# Patient Record
Sex: Male | Born: 1935 | Race: White | Hispanic: No | Marital: Married | Smoking: Former smoker
Health system: Southern US, Community
[De-identification: ages and names within clinical notes are randomized; demographics above are authoritative.]

## PROBLEM LIST (undated history)

## (undated) DIAGNOSIS — R55 Syncope and collapse: Secondary | ICD-10-CM

## (undated) DIAGNOSIS — I10 Essential (primary) hypertension: Secondary | ICD-10-CM

## (undated) DIAGNOSIS — N189 Chronic kidney disease, unspecified: Secondary | ICD-10-CM

## (undated) HISTORY — DX: Syncope and collapse: R55

## (undated) HISTORY — PX: GALLBLADDER SURGERY: SHX652

## (undated) HISTORY — PX: KIDNEY STONE SURGERY: SHX686

## (undated) HISTORY — PX: HERNIA REPAIR: SHX51

## (undated) HISTORY — PX: PROSTATE SURGERY: SHX751

## (undated) HISTORY — DX: Chronic kidney disease, unspecified: N18.9

## (undated) HISTORY — DX: Essential (primary) hypertension: I10

---

## 1988-09-10 HISTORY — PX: INTRAOCULAR LENS IMPLANT, SECONDARY: SHX1842

## 2009-02-12 ENCOUNTER — Emergency Department: Payer: Self-pay | Admitting: Emergency Medicine

## 2009-12-23 ENCOUNTER — Ambulatory Visit: Payer: Self-pay | Admitting: Physician Assistant

## 2010-01-11 ENCOUNTER — Ambulatory Visit: Payer: Self-pay | Admitting: Specialist

## 2011-02-09 DIAGNOSIS — R55 Syncope and collapse: Secondary | ICD-10-CM

## 2011-02-09 HISTORY — DX: Syncope and collapse: R55

## 2011-03-07 ENCOUNTER — Observation Stay: Payer: Self-pay | Admitting: Internal Medicine

## 2011-03-07 DIAGNOSIS — R55 Syncope and collapse: Secondary | ICD-10-CM

## 2011-03-07 DIAGNOSIS — I10 Essential (primary) hypertension: Secondary | ICD-10-CM

## 2011-03-07 DIAGNOSIS — I498 Other specified cardiac arrhythmias: Secondary | ICD-10-CM

## 2011-03-07 DIAGNOSIS — I517 Cardiomegaly: Secondary | ICD-10-CM

## 2011-03-08 DIAGNOSIS — I498 Other specified cardiac arrhythmias: Secondary | ICD-10-CM

## 2011-03-12 ENCOUNTER — Encounter: Payer: Medicare Other | Admitting: Cardiovascular Disease

## 2011-03-12 ENCOUNTER — Ambulatory Visit (INDEPENDENT_AMBULATORY_CARE_PROVIDER_SITE_OTHER): Payer: Medicare Other | Admitting: Cardiovascular Disease

## 2011-03-12 ENCOUNTER — Encounter: Payer: Self-pay | Admitting: Cardiovascular Disease

## 2011-03-12 DIAGNOSIS — N189 Chronic kidney disease, unspecified: Secondary | ICD-10-CM | POA: Insufficient documentation

## 2011-03-12 DIAGNOSIS — R609 Edema, unspecified: Secondary | ICD-10-CM

## 2011-03-12 DIAGNOSIS — I10 Essential (primary) hypertension: Secondary | ICD-10-CM | POA: Insufficient documentation

## 2011-03-12 DIAGNOSIS — R42 Dizziness and giddiness: Secondary | ICD-10-CM | POA: Insufficient documentation

## 2011-03-12 MED ORDER — FUROSEMIDE 20 MG PO TABS: 20.0000 mg | ORAL_TABLET | Freq: Every day | ORAL | Status: DC | PRN

## 2011-03-12 MED ORDER — HYDRALAZINE HCL 50 MG PO TABS: 50.0000 mg | ORAL_TABLET | Freq: Three times a day (TID) | ORAL | Status: DC

## 2011-03-12 NOTE — Patient Instructions (Addendum)
You are doing well. Please increase the hydralazine to 50 mg three times a day Monitor the blood pressure, drop the numbers to the office in 2 weeks. Please call us if you have new issues that need to be addressed before your next appt.  Your physician recommends that you schedule a follow-up appointment in: 1 month

## 2011-03-12 NOTE — Assessment & Plan Note (Signed)
He had an episode of dizziness with noted bradycardia on admission to the hospital. He has had no further lightheadedness. On telemetry, he did have bradycardia overnight while sleeping though rate improved  during the daytime. He certainly could have sick sinus currently his rate is adequate. He is asymptomatic. We'll continue to monitor him. He reports having a two-day Holter and we will look for these results at [facility].

## 2011-03-12 NOTE — Assessment & Plan Note (Addendum)
Creatinine of 2.2 in the hospital. Stage III chronic kidney disease, stable.  I suspect this is from long-standing hypertension. We have discussed this with him and we'll work to improve his blood pressure.

## 2011-03-12 NOTE — Progress Notes (Signed)
   Patient ID: [name], male    DOB: 1911/05/26, 77 y.o.   MRN: [id number]  HPI Comments: [name] is a 77 year old gentleman, known to me from [facility] with a history of chronic hypertension that has been poorly controlled, renal dysfunction, who presented to the hospital on June 27 with bradycardia, severe hypertension. Cardiology was consulted to manage his blood pressure. He was discharged on hydralazine t.i.d. Family reports that he is only taking it b.i.d..  He is no complaints though does report that his blood pressure continues to be elevated. Systolic pressures at home are typically in the 160-180 range. Heart rates have been well controlled and typically in the 50-70 range. He has no symptoms of lightheadedness. He does have problems with urine retention, polyuria at night time.  He denies any shortness of breath or chest pain. Otherwise has no complaints. He likes to work in his garden.  Echo showed normal systolic function, EF >55%, mild to moderately elevated right ventricular systolic pressures, diastolic dysfunction  EKG shows normal sinus rhythm with rate 73 beats per minute, left axis deviation     Review of Systems  Constitutional: Negative.   HENT: Negative.   Eyes: Negative.   Respiratory: Negative.   Cardiovascular: Negative.   Gastrointestinal: Negative.   Musculoskeletal: Negative.   Skin: Negative.   Neurological: Negative.   Hematological: Negative.   Psychiatric/Behavioral: Negative.   All other systems reviewed and are negative.    BP 177/80  Pulse 73  Ht 5\' 5"  (1.651 m)  Wt 155 lb (70.308 kg)  BMI 25.79 kg/m2  Physical Exam  Nursing note and vitals reviewed. Constitutional: He is oriented to person, place, and time. He appears well-developed and well-nourished.  HENT:  Head: Normocephalic.  Nose: Nose normal.  Mouth/Throat: Oropharynx is clear and moist.       Hard of hearing.  Eyes: Conjunctivae are normal. Pupils are equal, round, and  reactive to light.  Neck: Normal range of motion. Neck supple. No JVD present. Carotid bruit is present.  Cardiovascular: Normal rate, regular rhythm, S1 normal, S2 normal and intact distal pulses.  Exam reveals no gallop and no friction rub.   Murmur heard.  Crescendo systolic murmur is present with a grade of 2/6  Pulses:      Carotid pulses are 2+ on the right side, and 2+ on the left side with bruit.      Radial pulses are 2+ on the right side, and 2+ on the left side.       Posterior tibial pulses are 2+ on the right side, and 2+ on the left side.       Trace pitting edema above the sock line in the mid shin  Pulmonary/Chest: Effort normal and breath sounds normal. No respiratory distress. He has no wheezes. He has no rales. He exhibits no tenderness.  Abdominal: Soft. Bowel sounds are normal. He exhibits no distension. There is no tenderness.  Musculoskeletal: Normal range of motion. He exhibits edema. He exhibits no tenderness.  Lymphadenopathy:    He has no cervical adenopathy.  Neurological: He is alert and oriented to person, place, and time. Coordination normal.  Skin: Skin is warm and dry. No rash noted. No erythema.  Psychiatric: He has a normal mood and affect. His behavior is normal. Judgment and thought content normal.           Assessment and Plan

## 2011-03-12 NOTE — Assessment & Plan Note (Addendum)
Echocardiogram had showed mild to moderately elevated right ventricular systolic pressures. He is reluctant to take a water pill. He does have trace edema that is pitting bilaterally. We have given a prescription for Lasix 20 mg to be taken p.r.n. For worsening edema

## 2011-03-12 NOTE — Assessment & Plan Note (Signed)
Blood pressure continues to be elevated. We have talked to him about the blood pressure medication and he is willing to take it. We'll increase the hydralazine to 50 mg t.i.d. We'll have the family got off blood pressure measurements in 2 weeks' time. If he continues to be elevated, we could increase the hydralazine to 50 mg q.i.d. Or 100 mg t.i.d..

## 2011-03-13 ENCOUNTER — Telehealth: Payer: Self-pay | Admitting: *Deleted

## 2011-03-13 NOTE — Telephone Encounter (Signed)
Pt's daughter called this AM stating that pt is having side effects of increased dose of hydralazine that was changed in office yesterday. Pt was previously taking Hydralazine 25mg  bid (had been taking for about 5 days) without any side effects. States after incr to 50mg  tid, pt did not sleep, felt cold all night and feels very jittery this AM after his AM dose and his symptoms are "wearing off now at 1:00pm." Advised that pt return to original dose until further notice, and will discuss with Dr. Mariah Milling. Please advise.

## 2011-03-13 NOTE — Telephone Encounter (Signed)
Spoke to pt's daughter after discussing with Dr. Mariah Milling. Advised that pt take 25mg  tid to start, then titrate up to QID. Notified that he may just need to adjust to higher dosage, and symptoms may improve after this. Pt will call us back with any changes.

## 2011-03-19 ENCOUNTER — Telehealth: Payer: Self-pay | Admitting: *Deleted

## 2011-03-19 MED ORDER — HYDRALAZINE HCL 25 MG PO TABS: 25.0000 mg | ORAL_TABLET | Freq: Four times a day (QID) | ORAL | Status: DC

## 2011-03-19 NOTE — Telephone Encounter (Signed)
Pt was recently incr from Hydralazine 25mg  to 50mg  and had a reaction per phone note, so we decr back to 25mg  tid, and if tolerates to titrate to qid. Pt's wife calling stating they need new rx with changes that were made. Pt has been tolerating 25mg  tid, and will incr to qid.

## 2011-04-11 ENCOUNTER — Encounter: Payer: Self-pay | Admitting: Cardiovascular Disease

## 2011-04-12 ENCOUNTER — Encounter: Payer: Self-pay | Admitting: Cardiovascular Disease

## 2011-04-12 ENCOUNTER — Ambulatory Visit (INDEPENDENT_AMBULATORY_CARE_PROVIDER_SITE_OTHER): Payer: Medicare Other | Admitting: Cardiovascular Disease

## 2011-04-12 DIAGNOSIS — I10 Essential (primary) hypertension: Secondary | ICD-10-CM

## 2011-04-12 DIAGNOSIS — R42 Dizziness and giddiness: Secondary | ICD-10-CM

## 2011-04-12 DIAGNOSIS — N189 Chronic kidney disease, unspecified: Secondary | ICD-10-CM

## 2011-04-12 DIAGNOSIS — R609 Edema, unspecified: Secondary | ICD-10-CM

## 2011-04-12 MED ORDER — HYDRALAZINE HCL 25 MG PO TABS: 25.0000 mg | ORAL_TABLET | Freq: Four times a day (QID) | ORAL | Status: DC

## 2011-04-12 NOTE — Assessment & Plan Note (Signed)
History of chronic renal insufficiency, likely from long history of hypertension

## 2011-04-12 NOTE — Assessment & Plan Note (Signed)
He does not report any significant dizziness at this time.

## 2011-04-12 NOTE — Progress Notes (Signed)
   Patient ID: [name], male    DOB: 12/02/1910, 77 y.o.   MRN: [id number]  HPI Comments: [name] is a 77 year old gentleman, known to me from [name] with a history of chronic hypertension that has been poorly controlled, renal dysfunction, who presented to the hospital on June 27 with bradycardia, severe hypertension. Cardiology was consulted to manage his blood pressure. He was discharged on hydralazine t.i.d.  He is no complaints though does report that his blood pressure continues to be elevated. Systolic pressures at home are typically in the 140s to 160s.Old  Heart rates have been well controlled and typically in the 50-70 range. He has no symptoms of lightheadedness. He does have problems with urine retention, polyuria at night time.  He denies any shortness of breath or chest pain. Otherwise has no complaints. He likes to work in his garden.  Echo showed normal systolic function, EF >55%, mild to moderately elevated right ventricular systolic pressures, diastolic dysfunction  Old EKG shows normal sinus rhythm with rate 73 beats per minute, left axis deviation    Outpatient Encounter Prescriptions as of 04/12/2011  Medication Sig Dispense Refill  . aspirin 81 MG tablet Take 81 mg by mouth daily.        . furosemide (LASIX) 20 MG tablet Take 1 tablet (20 mg total) by mouth daily as needed.  30 tablet  6  .  hydrALAZINE (APRESOLINE) 25 MG tablet Take 25 mg by mouth 3 (three) times daily.         Review of Systems  Constitutional: Negative.   HENT: Negative.   Eyes: Negative.   Respiratory: Negative.   Cardiovascular: Negative.   Gastrointestinal: Negative.   Musculoskeletal: Negative.   Skin: Negative.   Neurological: Negative.   Hematological: Negative.   Psychiatric/Behavioral: Negative.   All other systems reviewed and are negative.   BP 147/61  Pulse 59  Ht 5\' 4"  (1.626 m)  Wt 156 lb (70.761 kg)  BMI 26.78 kg/m2  Physical Exam  Nursing note and vitals  reviewed. Constitutional: He is oriented to person, place, and time. He appears well-developed and well-nourished.       Elderly gentleman   HENT:  Head: Normocephalic.  Nose: Nose normal.  Mouth/Throat: Oropharynx is clear and moist.  Eyes: Conjunctivae are normal. Pupils are equal, round, and reactive to light.  Neck: Normal range of motion. Neck supple. No JVD present.  Cardiovascular: Normal rate, regular rhythm, S1 normal, S2 normal, normal heart sounds and intact distal pulses.  Exam reveals no gallop and no friction rub.   No murmur heard. Pulmonary/Chest: Effort normal and breath sounds normal. No respiratory distress. He has no wheezes. He has no rales. He exhibits no tenderness.  Abdominal: Soft. Bowel sounds are normal. He exhibits no distension. There is no tenderness.  Musculoskeletal: Normal range of motion. He exhibits no edema and no tenderness.  Lymphadenopathy:    He has no cervical adenopathy.  Neurological: He is alert and oriented to person, place, and time. Coordination normal.  Skin: Skin is warm and dry. No rash noted. No erythema.  Psychiatric: He has a normal mood and affect. His behavior is normal. Judgment and thought content normal.           Assessment and Plan

## 2011-04-12 NOTE — Assessment & Plan Note (Signed)
We will increase his hydralazine to 25 mg q.i.d. We tried 50 mg t.i.d. And telephone notes indicate that he did not tolerate this well. Notes indicate reaction though he is uncertain as to the details of what happened. Family is also unaware.

## 2011-04-12 NOTE — Assessment & Plan Note (Signed)
Edema has improved, very mild. It does come and go. We have suggested he take Lasix p.r.n..

## 2011-04-12 NOTE — Patient Instructions (Signed)
You are doing well. Please increase the hydralazine to four times a day Please call us if you have new issues that need to be addressed before your next appt.  We will call you for a follow up Appt. In 6 months

## 2011-05-02 ENCOUNTER — Ambulatory Visit (INDEPENDENT_AMBULATORY_CARE_PROVIDER_SITE_OTHER): Payer: 59 | Admitting: Internal Medicine

## 2011-05-02 ENCOUNTER — Encounter: Payer: Self-pay | Admitting: Internal Medicine

## 2011-05-02 ENCOUNTER — Ambulatory Visit: Payer: Medicare Other | Admitting: Internal Medicine

## 2011-05-02 ENCOUNTER — Ambulatory Visit: Payer: Self-pay | Admitting: Internal Medicine

## 2011-05-02 VITALS — BP 147/76 | HR 70 | Temp 98.0°F | Resp 14 | Ht 64.0 in | Wt 153.5 lb

## 2011-05-02 DIAGNOSIS — R5383 Other fatigue: Secondary | ICD-10-CM

## 2011-05-02 DIAGNOSIS — R5381 Other malaise: Secondary | ICD-10-CM

## 2011-05-02 DIAGNOSIS — M25572 Pain in left ankle and joints of left foot: Secondary | ICD-10-CM

## 2011-05-02 DIAGNOSIS — M25579 Pain in unspecified ankle and joints of unspecified foot: Secondary | ICD-10-CM

## 2011-05-02 LAB — CBC WITH DIFFERENTIAL/PLATELET
Basophils Relative: 0.8 % (ref 0.0–3.0)
Eosinophils Absolute: 0.2 10*3/uL (ref 0.0–0.7)
HCT: 33 % — ABNORMAL LOW (ref 39.0–52.0)
Hemoglobin: 10.9 g/dL — ABNORMAL LOW (ref 13.0–17.0)
Lymphocytes Relative: 23.8 % (ref 12.0–46.0)
Lymphs Abs: 1.3 10*3/uL (ref 0.7–4.0)
MCHC: 33.1 g/dL (ref 30.0–36.0)
Monocytes Relative: 8 % (ref 3.0–12.0)
Neutro Abs: 3.6 10*3/uL (ref 1.4–7.7)
RBC: 3.62 Mil/uL — ABNORMAL LOW (ref 4.22–5.81)

## 2011-05-02 LAB — COMPREHENSIVE METABOLIC PANEL
BUN: 45 mg/dL — ABNORMAL HIGH (ref 6–23)
CO2: 25 mEq/L (ref 19–32)
Calcium: 9.1 mg/dL (ref 8.4–10.5)
Chloride: 108 mEq/L (ref 96–112)
Creatinine, Ser: 2.3 mg/dL — ABNORMAL HIGH (ref 0.4–1.5)
GFR: 27.9 mL/min — ABNORMAL LOW (ref >60.00)

## 2011-05-02 LAB — VITAMIN B12: Vitamin B-12: 513 pg/mL (ref 211–911)

## 2011-05-02 NOTE — Progress Notes (Signed)
Subjective:    Patient ID: [name], male    DOB: Dec 22, 1910, 76 y.o.   MRN: [id number]  HPI  Mr. Denz is a 77 year old male with a history of hypertension and chronic kidney disease who presents for an acute visit for left ankle pain. He reports that his left ankle has been bothering him for approximately 2-3 months. He denies any known injury to his ankle. He describes the pain as aching pain which occurs with weightbearing. The pain is most prominent on the lateral aspect of his left ankle. He has been applying both heat and ice to his ankle without any improvement in his symptoms. He has not been taking any medication for pain. He denies any instability in his ankle. He denies any recent falls. He reports mild swelling in his left ankle. He denies any redness over his left ankle. He denies any fever, chills.   Mr. Berent also complains of mild fatigue which has been present over the last several weeks. Of note his wife has been very ill and has been in the hospital on several occasions and had recent orthopedic surgery. He does have knowledge some anxiety in regards to his wife's care which has limited his sleep. He reports a good appetite, and has been able to do his normal activities such as gardening. He is concerned that some of his new medications for hypertension may be contributing to his fatigue. He denies any chest pain, diaphoresis, fever, chills, or other symptoms.  Outpatient Encounter Prescriptions as of 05/02/2011  Medication Sig Dispense Refill  . aspirin 81 MG tablet Take 81 mg by mouth daily.        . furosemide (LASIX) 20 MG tablet Take 1 tablet (20 mg total) by mouth daily as needed.  30 tablet  6  . hydrALAZINE (APRESOLINE) 25 MG tablet Take 1 tablet (25 mg total) by mouth 4 (four) times daily.  120 tablet  11     Review of Systems  Constitutional: Negative for fever, chills, diaphoresis, appetite change and fatigue.  HENT: Positive for hearing loss. Negative for trouble  swallowing.   Eyes: Negative for visual disturbance.  Respiratory: Negative for chest tightness, shortness of breath and wheezing.   Cardiovascular: Positive for leg swelling. Negative for chest pain.  Gastrointestinal: Negative for nausea, vomiting, abdominal pain, diarrhea, constipation and blood in stool.  Genitourinary: Positive for urgency. Negative for dysuria.  Musculoskeletal: Positive for myalgias, joint swelling and arthralgias. Negative for gait problem.  Skin: Negative for color change.  Neurological: Positive for speech difficulty. Negative for weakness.  Psychiatric/Behavioral: Positive for sleep disturbance. Negative for behavioral problems and confusion. The patient is nervous/anxious.    BP 147/76  Pulse 70  Temp(Src) 98 F (36.7 C) (Oral)  Resp 14  Ht 5\' 4"  (1.626 m)  Wt 153 lb 8 oz (69.627 kg)  BMI 26.35 kg/m2  SpO2 99%     Objective:   Physical Exam  Constitutional: He is oriented to person, place, and time. He appears well-developed and well-nourished. No distress.  HENT:  Head: Normocephalic and atraumatic.  Eyes: Conjunctivae and EOM are normal. Pupils are equal, round, and reactive to light.  Neck: Normal range of motion.  Cardiovascular: Normal rate and regular rhythm.   Pulmonary/Chest: Effort normal and breath sounds normal. No respiratory distress. He has no wheezes. He has no rales.  Musculoskeletal:       Feet:  Neurological: He is alert and oriented to person, place, and time.  Skin:  Skin is warm and dry. He is not diaphoretic. No erythema.  Psychiatric: He has a normal mood and affect. His behavior is normal. Judgment and thought content normal.          Assessment & Plan:  1. Ankle pain - patient with prolonged history of left lateral ankle pain. No history of trauma to his ankle. Exam is remarkable for tenderness over the left lateral malleolus. Plain film obtained today was unremarkable. He would likely benefit from nonsteroidals, however  he is unable to take them because of chronic kidney disease. We will plan to use an ankle brace over the next one to 2 weeks. He will continue to apply ice to his ankle several times per day. If his pain persists he may need additional evaluation by orthopedics. He will call if pain does not improve. Otherwise he will followup in one month.  2. Fatigue - likely multifactorial. Patient acknowledges limited sleep recently with caring for his wife who has been ill. He also has chronic kidney disease and has recently been placed on hydralazine and furosemide. We will check kidney function and electrolytes with labs. We'll also check thyroid function and B12 level as well as CBC. He will follow up in 1 month.

## 2011-05-02 NOTE — Patient Instructions (Signed)
Ankle Pain Ankle pain is a common symptom. The bones, cartilage, tendons, and muscles of the ankle joint perform a lot of work each day. The ankle joint holds your body weight and allows you to move around. Ankle pain can occur on either side or back of 1 or both ankles. Ankle pain may be sharp and burning or dull and aching. There may be tenderness, stiffness, redness, or warmth around the ankle. The pain occurs more often when a person walks or puts pressure on the ankle. CAUSES There are many reasons ankle pain can develop. It is important to work with your caregiver to identify the cause since many conditions can impact the bones, cartilage, muscles, and tendons. Causes for ankle pain include:  Injury, including a break (fracture), sprain, or strain often due to a fall, sports, or a high-impact activity.   Swelling (inflammation) of a tendon (tendonitis).   Achilles tendon rupture.   Ankle instability after repeated sprains and strains.   Poor foot alignment.   Pressure on a nerve (tarsal tunnel syndrome).   Arthritis in the ankle or the lining of the ankle.   Crystal formation in the ankle (gout or pseudogout).  DIAGNOSIS A diagnosis is based on your medical history, your symptoms, results of your physical exam, and results of diagnostic tests. Diagnostic tests may include X-ray exams or a computerized magnetic scan (magnetic resonance imaging, MRI). TREATMENT Treatment will depend on the cause of your ankle pain and may include:  Keeping pressure off the ankle and limiting activities.   Using crutches or other walking support (a cane or brace).   Using rest, ice, compression, and elevation.   Participating in physical therapy or home exercises.   Wearing shoe inserts or special shoes.   Losing weight.   Taking medications to reduce pain or swelling or receiving an injection.   Undergoing surgery.  HOME CARE INSTRUCTIONS  Only take over-the-counter or prescription  medicines for pain, discomfort, or fever as directed by your caregiver.   Put ice on the injured area.   Put ice in a plastic bag.   Place a towel between your skin and the bag.   Leave the ice on for 20 minutes at a time, 3 times a day.   Keep your leg raised (elevated) when possible to lessen swelling.   Avoid activities that cause ankle pain.   Follow specific exercises as directed by your caregiver.   Record how often you have ankle pain, the location of the pain, and what it feels like. This information may be helpful to you and your caregiver.   Ask your caregiver about returning to work or sports and whether you should drive.   Follow up with your caregiver for further examination, therapy, or testing as directed.  SEEK MEDICAL CARE IF:  Pain or swelling continues or worsens beyond 1 week.   You have an oral temperature above 101.5.   You are feeling unwell or have chills.   You are having an increasingly difficult time with walking.   You have loss of sensation or other new symptoms.   You have questions or concerns.  MAKE SURE YOU:  Understand these instructions.   Will watch your condition.   Will get help right away if you are not doing well or get worse.  Document Released: 02/14/2010  [facility] Patient Information 2011 [place], [place].

## 2011-05-21 ENCOUNTER — Other Ambulatory Visit: Payer: Self-pay | Admitting: Internal Medicine

## 2011-05-21 ENCOUNTER — Telehealth: Payer: Self-pay | Admitting: *Deleted

## 2011-05-21 DIAGNOSIS — M79673 Pain in unspecified foot: Secondary | ICD-10-CM

## 2011-05-21 NOTE — Telephone Encounter (Signed)
Left message for someone to return my call.

## 2011-05-21 NOTE — Telephone Encounter (Signed)
Daughter is asking if patient could come in for labs for a uric acid. She feels that it should be checked because it hasn't been checked in a while and she thinks that it may be the cause of the pain in his feet. Please advise.

## 2011-05-21 NOTE — Telephone Encounter (Signed)
I put in a request for this earlier today. I asked for him to see Dr. Al Corpus.

## 2011-05-21 NOTE — Telephone Encounter (Signed)
That would be fine. Uric acid and a CMP

## 2011-05-21 NOTE — Telephone Encounter (Signed)
Spoke with daughter, she says that now want to hold off on the lab work, and instead are asking for a referral for him to see a podiatrist.

## 2011-05-22 ENCOUNTER — Encounter: Payer: Self-pay | Admitting: Internal Medicine

## 2011-05-23 ENCOUNTER — Encounter: Payer: Self-pay | Admitting: Internal Medicine

## 2011-06-28 ENCOUNTER — Telehealth: Payer: Self-pay

## 2011-06-28 MED ORDER — HYDRALAZINE HCL 25 MG PO TABS: 25.0000 mg | ORAL_TABLET | Freq: Four times a day (QID) | ORAL | Status: DC

## 2011-06-28 NOTE — Telephone Encounter (Signed)
Refill for hydralazine 25 mg qid

## 2011-08-23 ENCOUNTER — Ambulatory Visit (INDEPENDENT_AMBULATORY_CARE_PROVIDER_SITE_OTHER): Payer: 59 | Admitting: Internal Medicine

## 2011-08-23 ENCOUNTER — Encounter: Payer: Self-pay | Admitting: Internal Medicine

## 2011-08-23 VITALS — BP 152/66 | HR 74 | Temp 99.5°F | Wt 156.0 lb

## 2011-08-23 DIAGNOSIS — J40 Bronchitis, not specified as acute or chronic: Secondary | ICD-10-CM

## 2011-08-23 MED ORDER — GUAIFENESIN-CODEINE 100-10 MG/5ML PO SYRP: 5.0000 mL | ORAL_SOLUTION | Freq: Two times a day (BID) | ORAL | Status: DC | PRN

## 2011-08-23 MED ORDER — AZITHROMYCIN 250 MG PO TABS: ORAL_TABLET | ORAL | Status: AC

## 2011-08-23 NOTE — Progress Notes (Signed)
  Subjective:    Patient ID: [name], male    DOB: [birth year], 77 y.o.   MRN: [id number]  HPI 77 year old male presents for an acute visit complaining of a two-day history of cough productive of thick yellow sputum and some nasal congestion and postnasal drip. He denies shortness of breath or chest pain. He has felt cold at home but has not checked his temperature. Several others in his home have been sick with similar symptoms. He has not taken any medication for this.  Outpatient Encounter Prescriptions as of 08/23/2011  Medication Sig Dispense Refill  . aspirin 81 MG tablet Take 81 mg by mouth daily.        . hydrALAZINE (APRESOLINE) 25 MG tablet Take 1 tablet (25 mg total) by mouth 4 (four) times daily.  360 tablet  3  . furosemide (LASIX) 20 MG tablet Take 1 tablet (20 mg total) by mouth daily as needed.  30 tablet  6    Review of Systems  Constitutional: Positive for fever. Negative for chills, activity change, appetite change, fatigue and unexpected weight change.  HENT: Positive for congestion, rhinorrhea and postnasal drip.   Eyes: Negative for visual disturbance.  Respiratory: Positive for cough. Negative for shortness of breath.   Cardiovascular: Negative for chest pain, palpitations and leg swelling.  Gastrointestinal: Negative for abdominal pain and abdominal distention.  Skin: Negative for color change and rash.  Hematological: Negative for adenopathy.  Psychiatric/Behavioral: Negative for sleep disturbance and dysphoric mood. The patient is not nervous/anxious.    BP 152/66  Pulse 74  Temp(Src) 99.5 F (37.5 C) (Oral)  Wt 156 lb (70.761 kg)  SpO2 94%     Objective:   Physical Exam  Constitutional: He is oriented to person, place, and time. He appears well-developed and well-nourished. No distress.  HENT:  Head: Normocephalic and atraumatic.  Right Ear: External ear normal.  Left Ear: External ear normal.  Nose: Nose normal.  Mouth/Throat: Oropharynx is clear and  moist. No oropharyngeal exudate.  Eyes: Conjunctivae and EOM are normal. Pupils are equal, round, and reactive to light. Right eye exhibits no discharge. Left eye exhibits no discharge. No scleral icterus.  Neck: Normal range of motion. Neck supple. No tracheal deviation present. No thyromegaly present.  Cardiovascular: Normal rate, regular rhythm and normal heart sounds.  Exam reveals no gallop and no friction rub.   No murmur heard. Pulmonary/Chest: Effort normal. No respiratory distress. He has no decreased breath sounds. He has no wheezes. He has rhonchi. He has no rales. He exhibits no tenderness.  Musculoskeletal: Normal range of motion. He exhibits no edema.  Lymphadenopathy:    He has no cervical adenopathy.  Neurological: He is alert and oriented to person, place, and time. No cranial nerve deficit. Coordination normal.  Skin: Skin is warm and dry. No rash noted. He is not diaphoretic. No erythema. No pallor.  Psychiatric: He has a normal mood and affect. His behavior is normal. Judgment and thought content normal.          Assessment & Plan:  1. Bronchitis -  Symptoms are most consistent with early bronchitis. Will treat with azithromycin. Will use codeine as needed for cough. He will call or return to clinic if symptoms are not improving over the next 48 hours. Otherwise, he will followup in 2 weeks.

## 2011-09-06 ENCOUNTER — Ambulatory Visit: Payer: 59 | Admitting: Internal Medicine

## 2011-09-20 ENCOUNTER — Ambulatory Visit: Payer: Self-pay | Admitting: Physician Assistant

## 2012-02-08 ENCOUNTER — Inpatient Hospital Stay: Payer: Self-pay | Admitting: Internal Medicine

## 2012-02-08 LAB — CBC
HGB: 9.5 g/dL — ABNORMAL LOW (ref 13.0–18.0)
MCV: 93 fL (ref 80–100)
Platelet: 135 10*3/uL — ABNORMAL LOW (ref 150–440)
RBC: 3.17 10*6/uL — ABNORMAL LOW (ref 4.40–5.90)
WBC: 7.4 10*3/uL (ref 3.8–10.6)

## 2012-02-08 LAB — COMPREHENSIVE METABOLIC PANEL
Albumin: 2.9 g/dL — ABNORMAL LOW (ref 3.4–5.0)
Alkaline Phosphatase: 56 U/L (ref 50–136)
Anion Gap: 11 (ref 7–16)
Bilirubin,Total: 0.5 mg/dL (ref 0.2–1.0)
Calcium, Total: 8.6 mg/dL (ref 8.5–10.1)
Chloride: 110 mmol/L — ABNORMAL HIGH (ref 98–107)
Co2: 21 mmol/L (ref 21–32)
EGFR (Non-African Amer.): 22 — ABNORMAL LOW
Potassium: 4 mmol/L (ref 3.5–5.1)
SGOT(AST): 21 U/L (ref 15–37)
SGPT (ALT): 13 U/L
Sodium: 142 mmol/L (ref 136–145)

## 2012-02-08 LAB — URINALYSIS, COMPLETE
Bilirubin,UR: NEGATIVE
Blood: NEGATIVE
Glucose,UR: NEGATIVE mg/dL (ref 0–75)
Leukocyte Esterase: NEGATIVE
Ph: 5 (ref 4.5–8.0)
Squamous Epithelial: <1
WBC UR: 1 /HPF (ref 0–5)

## 2012-02-08 LAB — DIGOXIN LEVEL: Digoxin: 0.13 ng/mL

## 2012-02-08 LAB — TROPONIN I: Troponin-I: 0.04 ng/mL

## 2012-02-08 LAB — PRO B NATRIURETIC PEPTIDE: B-Type Natriuretic Peptide: 3163 pg/mL — ABNORMAL HIGH (ref 0–450)

## 2012-02-09 LAB — BASIC METABOLIC PANEL
BUN: 42 mg/dL — ABNORMAL HIGH (ref 7–18)
Calcium, Total: 8.4 mg/dL — ABNORMAL LOW (ref 8.5–10.1)
Chloride: 108 mmol/L — ABNORMAL HIGH (ref 98–107)
Co2: 22 mmol/L (ref 21–32)
EGFR (Non-African Amer.): 23 — ABNORMAL LOW
Glucose: 91 mg/dL (ref 65–99)
Potassium: 3.8 mmol/L (ref 3.5–5.1)

## 2012-02-09 LAB — CBC WITH DIFFERENTIAL/PLATELET
Basophil %: 0.3 %
Eosinophil #: 0.1 10*3/uL (ref 0.0–0.7)
Eosinophil %: 0.9 %
HCT: 27 % — ABNORMAL LOW (ref 40.0–52.0)
MCH: 30.4 pg (ref 26.0–34.0)
Monocyte #: 0.5 x10 3/mm (ref 0.2–1.0)
Monocyte %: 7.7 %
Neutrophil #: 5.1 10*3/uL (ref 1.4–6.5)
Platelet: 128 10*3/uL — ABNORMAL LOW (ref 150–440)
RBC: 2.96 10*6/uL — ABNORMAL LOW (ref 4.40–5.90)
RDW: 14.1 % (ref 11.5–14.5)

## 2012-02-13 LAB — CULTURE, BLOOD (SINGLE)

## 2012-05-07 ENCOUNTER — Other Ambulatory Visit: Payer: Self-pay | Admitting: Cardiovascular Disease

## 2012-05-07 NOTE — Telephone Encounter (Signed)
Refill sent for hydralazine  

## 2012-05-18 ENCOUNTER — Inpatient Hospital Stay: Payer: Self-pay | Admitting: Internal Medicine

## 2012-05-18 LAB — COMPREHENSIVE METABOLIC PANEL
Alkaline Phosphatase: 70 U/L (ref 50–136)
Anion Gap: 6 — ABNORMAL LOW (ref 7–16)
BUN: 34 mg/dL — ABNORMAL HIGH (ref 7–18)
Bilirubin,Total: 0.3 mg/dL (ref 0.2–1.0)
Calcium, Total: 8.8 mg/dL (ref 8.5–10.1)
Chloride: 113 mmol/L — ABNORMAL HIGH (ref 98–107)
Co2: 25 mmol/L (ref 21–32)
EGFR (African American): 23 — ABNORMAL LOW
EGFR (Non-African Amer.): 20 — ABNORMAL LOW
Osmolality: 294 (ref 275–301)
SGPT (ALT): 14 U/L (ref 12–78)
Sodium: 144 mmol/L (ref 136–145)

## 2012-05-18 LAB — CBC
HCT: 29.1 % — ABNORMAL LOW (ref 40.0–52.0)
HGB: 9.6 g/dL — ABNORMAL LOW (ref 13.0–18.0)
MCHC: 33.2 g/dL (ref 32.0–36.0)
MCV: 91 fL (ref 80–100)
Platelet: 153 10*3/uL (ref 150–440)
RBC: 3.21 10*6/uL — ABNORMAL LOW (ref 4.40–5.90)
RDW: 14.2 % (ref 11.5–14.5)
WBC: 3.8 10*3/uL (ref 3.8–10.6)

## 2012-05-19 LAB — CBC WITH DIFFERENTIAL/PLATELET
Basophil #: 0 10*3/uL (ref 0.0–0.1)
Basophil %: 1 %
Eosinophil %: 5.5 %
MCH: 29.6 pg (ref 26.0–34.0)
MCV: 90 fL (ref 80–100)
Neutrophil #: 2.4 10*3/uL (ref 1.4–6.5)
Neutrophil %: 54.6 %
Platelet: 158 10*3/uL (ref 150–440)
RBC: 3.14 10*6/uL — ABNORMAL LOW (ref 4.40–5.90)
RDW: 13.9 % (ref 11.5–14.5)
WBC: 4.5 10*3/uL (ref 3.8–10.6)

## 2012-05-19 LAB — BASIC METABOLIC PANEL
BUN: 33 mg/dL — ABNORMAL HIGH (ref 7–18)
Calcium, Total: 8.5 mg/dL (ref 8.5–10.1)
Creatinine: 2.39 mg/dL — ABNORMAL HIGH (ref 0.60–1.30)
EGFR (African American): 25 — ABNORMAL LOW
EGFR (Non-African Amer.): 22 — ABNORMAL LOW
Glucose: 82 mg/dL (ref 65–99)
Osmolality: 293 (ref 275–301)
Potassium: 4.4 mmol/L (ref 3.5–5.1)

## 2012-05-21 ENCOUNTER — Telehealth: Payer: Self-pay | Admitting: Internal Medicine

## 2012-05-21 NOTE — Telephone Encounter (Signed)
Patient has hospital follow up 9.26.13 @ 9:30.

## 2012-05-24 LAB — WOUND AEROBIC CULTURE

## 2012-05-24 LAB — CULTURE, BLOOD (SINGLE)

## 2012-05-28 ENCOUNTER — Telehealth: Payer: Self-pay | Admitting: Internal Medicine

## 2012-05-28 ENCOUNTER — Ambulatory Visit (INDEPENDENT_AMBULATORY_CARE_PROVIDER_SITE_OTHER): Payer: 59 | Admitting: Cardiovascular Disease

## 2012-05-28 ENCOUNTER — Encounter: Payer: Self-pay | Admitting: Cardiovascular Disease

## 2012-05-28 VITALS — BP 130/62 | HR 76 | Ht 62.0 in | Wt 153.8 lb

## 2012-05-28 DIAGNOSIS — R609 Edema, unspecified: Secondary | ICD-10-CM

## 2012-05-28 DIAGNOSIS — I10 Essential (primary) hypertension: Secondary | ICD-10-CM

## 2012-05-28 DIAGNOSIS — N4889 Other specified disorders of penis: Secondary | ICD-10-CM

## 2012-05-28 MED ORDER — NYSTATIN-TRIAMCINOLONE 100000-0.1 UNIT/GM-% EX OINT: TOPICAL_OINTMENT | Freq: Three times a day (TID) | CUTANEOUS | Status: DC

## 2012-05-28 NOTE — Assessment & Plan Note (Signed)
Left lower extremity edema is likely lymphedema. No signs of heart failure apart from the left leg edema. No shortness of breath, no edema the right. We have suggested he wear a compression hose, possibly ace wrap. Uncertain if symptoms have been exacerbated by recent injury to his left leg. Stitches to be removed this week.

## 2012-05-28 NOTE — Progress Notes (Signed)
Patient ID: [name], male    DOB: 10-04-1910, 78 y.o.   MRN: [id number]  HPI Comments: [name] is a 78 year old gentleman with a history of chronic hypertension, renal dysfunction, who presented to the hospital on March 06 2012 with bradycardia, severe hypertension. Cardiology was consulted to manage his blood pressure. He was discharged on hydralazine t.i.d.  Today he reports that his blood pressure at home has been well controlled. Systolic pressure typically in the 130 range. Heart rate is in the 50-70 range. He does report having prostate issues and has polyuria at night time with urine retention.  He does report having a more serious issue with swelling of the penis which has been coming on for the past month, worse recently. He denies any significant pain but reports it is getting more difficult to urinate.  He was cutting wood recently with a skill saw and lacerated his left leg. He required 20 stitches, hospitalization, cultured MRSA. He was in the hospital for several days, and recently finished antibiotics. It is wrapped today though he reports is much better. He is scheduled in several days to have stitches removed.  He does report having swelling in his left leg for quite some time. No swelling in the right leg. This was there prior to his recent accident with the saw. Ultrasound in the hospital 05/18/2012 showed no DVT.  He denies any shortness of breath or chest pain. Otherwise has no complaints. He likes to work in his garden.  Previous Echo showed normal systolic function, EF >55%, mild to moderately elevated right ventricular systolic pressures, diastolic dysfunction  Old EKG shows normal sinus rhythm with rate 75 beats per minute, left axis deviation    Outpatient Encounter Prescriptions as of 05/28/2012  Medication Sig Dispense Refill  . furosemide (LASIX) 20 MG tablet Take 1 tablet (20 mg total) by mouth daily as needed.  30 tablet  6  . guaiFENesin-codeine (ROBITUSSIN  AC) 100-10 MG/5ML syrup Take 5 mLs by mouth 2 (two) times daily as needed for cough.  240 mL  0  . hydrALAZINE (APRESOLINE) 25 MG tablet TAKE 1 TABLET FOUR TIMES A DAY  360 tablet  3  . DISCONTD: aspirin 81 MG tablet Take 81 mg by mouth daily.          Review of Systems  Constitutional: Negative.   HENT: Negative.   Eyes: Negative.   Respiratory: Negative.   Cardiovascular: Positive for leg swelling.  Gastrointestinal: Negative.   Genitourinary: Positive for penile swelling and difficulty urinating.  Musculoskeletal: Negative.   Skin: Negative.   Neurological: Negative.   Hematological: Negative.   Psychiatric/Behavioral: Negative.   All other systems reviewed and are negative.   BP 130/62  Pulse 76  Ht 5\' 2"  (1.575 m)  Wt 153 lb 12 oz (69.741 kg)  BMI 28.12 kg/m2  Physical Exam  Nursing note and vitals reviewed. Constitutional: He is oriented to person, place, and time. He appears well-developed and well-nourished.       Elderly gentleman   HENT:  Head: Normocephalic.  Nose: Nose normal.  Mouth/Throat: Oropharynx is clear and moist.  Eyes: Conjunctivae normal are normal. Pupils are equal, round, and reactive to light.  Neck: Normal range of motion. Neck supple. No JVD present.  Cardiovascular: Normal rate, regular rhythm, S1 normal, S2 normal, normal heart sounds and intact distal pulses.  Exam reveals no gallop and no friction rub.   No murmur heard.      Left lower  extremity with 1-2+ pitting edema to the midshin, no swelling of the right  Pulmonary/Chest: Effort normal and breath sounds normal. No respiratory distress. He has no wheezes. He has no rales. He exhibits no tenderness.  Abdominal: Soft. Bowel sounds are normal. He exhibits no distension. There is no tenderness.  Musculoskeletal: Normal range of motion. He exhibits edema. He exhibits no tenderness.  Lymphadenopathy:    He has no cervical adenopathy.  Neurological: He is alert and oriented to person, place,  and time. Coordination normal.  Skin: Skin is warm and dry. No rash noted. No erythema.  Psychiatric: He has a normal mood and affect. His behavior is normal. Judgment and thought content normal.           Assessment and Plan

## 2012-05-28 NOTE — Telephone Encounter (Signed)
Patient has an appt scheduled on 06/05/2012.

## 2012-05-28 NOTE — Telephone Encounter (Signed)
Caller: Joanne/[name]; Patient Name: [name]; PCP: Ronna Polio (Adults only); Best Callback Phone Number: [phone]; Call regarding Growth under Penis, size of Golf ball, onset 2 months.  Patient denies pain, urinating normally. Afebrile. Daughter hasn't actuall seen growth, Patient not at home or complete assessment.  All emergent symptoms ruled out per Penis problems protocol, see in 24 hours due to new swelling of shaft of penis.  Appointment scheduled at 11:30 on 9-19 with Dr Dan Humphreys.  Daughter verbalized understanding.

## 2012-05-28 NOTE — Assessment & Plan Note (Signed)
Blood pressure is well controlled on today's visit. No changes made to the medications. 

## 2012-05-28 NOTE — Patient Instructions (Addendum)
Please use nystatin cream three times a day to affected arrea   Please call us if you have new issues that need to be addressed before your next appt.  Your physician wants you to follow-up in: 6 months.  You will receive a reminder letter in the mail two months in advance. If you don't receive a letter, please call our office to schedule the follow-up appointment.  You have been referred to Dr. Achilles Dunk (urology). You have an appointment Friday 05/30/12 at 8:30 am.  Please arrive 15 minutes early and bring your insurance cards and medications with you. Their phone number is [phone].

## 2012-05-28 NOTE — Assessment & Plan Note (Signed)
Possible constriction of his foreskin causing edema. Case was discussed with Dr. Achilles Dunk. We'll start nystatin triamcinolone cream 3 times a day. Appointment made for later this week for further evaluation.

## 2012-05-29 ENCOUNTER — Ambulatory Visit: Payer: 59 | Admitting: Internal Medicine

## 2012-05-30 ENCOUNTER — Ambulatory Visit: Payer: 59 | Admitting: Internal Medicine

## 2012-06-05 ENCOUNTER — Ambulatory Visit: Payer: 59 | Admitting: Internal Medicine

## 2012-06-12 ENCOUNTER — Ambulatory Visit: Payer: 59 | Admitting: Internal Medicine

## 2012-06-27 ENCOUNTER — Telehealth: Payer: Self-pay | Admitting: Internal Medicine

## 2012-06-27 ENCOUNTER — Encounter: Payer: Self-pay | Admitting: Internal Medicine

## 2012-06-27 ENCOUNTER — Ambulatory Visit (INDEPENDENT_AMBULATORY_CARE_PROVIDER_SITE_OTHER)
Admission: RE | Admit: 2012-06-27 | Discharge: 2012-06-27 | Disposition: A | Discharge status: Home / Self Care | Payer: 59 | Source: Ambulatory Visit | Attending: Internal Medicine | Admitting: Internal Medicine

## 2012-06-27 ENCOUNTER — Other Ambulatory Visit: Payer: Self-pay | Admitting: Internal Medicine

## 2012-06-27 ENCOUNTER — Ambulatory Visit (INDEPENDENT_AMBULATORY_CARE_PROVIDER_SITE_OTHER): Payer: 59 | Admitting: Internal Medicine

## 2012-06-27 ENCOUNTER — Other Ambulatory Visit: Payer: Self-pay | Admitting: Cardiology

## 2012-06-27 VITALS — BP 172/80 | HR 77 | Temp 97.9°F | Ht 64.0 in | Wt 144.2 lb

## 2012-06-27 DIAGNOSIS — J40 Bronchitis, not specified as acute or chronic: Secondary | ICD-10-CM

## 2012-06-27 DIAGNOSIS — R05 Cough: Secondary | ICD-10-CM

## 2012-06-27 DIAGNOSIS — R0989 Other specified symptoms and signs involving the circulatory and respiratory systems: Secondary | ICD-10-CM

## 2012-06-27 DIAGNOSIS — S81809A Unspecified open wound, unspecified lower leg, initial encounter: Secondary | ICD-10-CM

## 2012-06-27 DIAGNOSIS — S81009A Unspecified open wound, unspecified knee, initial encounter: Secondary | ICD-10-CM

## 2012-06-27 DIAGNOSIS — S81802A Unspecified open wound, left lower leg, initial encounter: Secondary | ICD-10-CM | POA: Insufficient documentation

## 2012-06-27 MED ORDER — GENTAMICIN SULFATE 0.1 % EX OINT: TOPICAL_OINTMENT | Freq: Three times a day (TID) | CUTANEOUS | Status: DC

## 2012-06-27 MED ORDER — CEPHALEXIN 500 MG PO CAPS: 500.0000 mg | ORAL_CAPSULE | Freq: Two times a day (BID) | ORAL | Status: DC

## 2012-06-27 MED ORDER — AZITHROMYCIN 250 MG PO TABS: ORAL_TABLET | ORAL | Status: DC

## 2012-06-27 NOTE — Patient Instructions (Signed)
Start with azithromycin. If any problems with this medication, change to Cephalexin. Call or email with update.

## 2012-06-27 NOTE — Assessment & Plan Note (Signed)
Patient complains of intermittent hissing sound in his right ear. He has notable loud right carotid bruit. Will get carotid Dopplers for further evaluation.

## 2012-06-27 NOTE — Telephone Encounter (Signed)
Caller: Joanne/Daughter; Patient Name: [name]; PCP: Ronna Polio (Adults only); [facility] Callback Phone Number: [phone]; Reason for call: Cough/Congestion History of cutting leg with saw in the last few weeks, seen in [facility] and sent to ER infected with MRSA, admitted for 2 days and released 9/13,  Also has had Bronchitis with cough, green sputum, took antibiotic and color mucus cleared. Still a lot of cough for 3-4 weeks, now clear sputum.  Visiting nurse says crackle in lungs yesterday 10/17.  Cough worse at night, waking a lot due to the cough.  Afebrile.  Caller very confused about dates seen and treated.  Triaged in Cough Guideline - Disposition:  See Provider Within 24 Hours due to Recurrent episodes of uncontrolled coughing interfering with ability to carry out usual activities or with normal sleep patterns.   Unable to find appointment time in [facility] for today.   PLEASE REVIEW AND CONTACT DAUGHTER AT  [phone].

## 2012-06-27 NOTE — Progress Notes (Signed)
Subjective:    Patient ID: [name], male    DOB: 1910-11-25, 78 y.o.   MRN: [id number]  HPI 78 year old male with history of hypertension presents for acute visit complaining of several day history of increased nasal congestion and productive cough. He denies any fever or chills. He denies shortness of breath. He has not been taking any medication for this.  He is also concerned today about open wound on his left thigh. He recently had an injury where he cut his left thigh with a saw.  This was sutured and has healed well except for the proximal edge of the wound. At this site, he had tried to remove some visible stitches under the surface. Since that time, he has had some purulent drainage and bleeding. He denies tenderness, fever, chills.  He is also concerned about a repetitive hissing sound in his right ear. He denies any ear pain. He has chronic hearing loss.   Outpatient Encounter Prescriptions as of 06/27/2012  Medication Sig Dispense Refill  . furosemide (LASIX) 20 MG tablet Take 1 tablet (20 mg total) by mouth daily as needed.  30 tablet  6  . guaiFENesin-codeine (ROBITUSSIN AC) 100-10 MG/5ML syrup Take 5 mLs by mouth 2 (two) times daily as needed for cough.  240 mL  0  . hydrALAZINE (APRESOLINE) 25 MG tablet TAKE 1 TABLET FOUR TIMES A DAY  360 tablet  3  . nystatin-triamcinolone ointment (MYCOLOG) Apply topically 3 (three) times daily.  30 g  3  . azithromycin (ZITHROMAX Z-PAK) 250 MG tablet Take 2 pills day 1, then 1 pill daily days 2-5  6 each  0  . cephALEXin (KEFLEX) 500 MG capsule Take 1 capsule (500 mg total) by mouth 2 (two) times daily.  20 capsule  0  . gentamicin ointment (GARAMYCIN) 0.1 % Apply topically 3 (three) times daily.  15 g  0   BP 172/80  Pulse 77  Temp 97.9 F (36.6 C) (Oral)  Ht 5\' 4"  (1.626 m)  Wt 144 lb 4 oz (65.431 kg)  BMI 24.76 kg/m2  SpO2 97%  Review of Systems  Constitutional: Negative for fever, chills, activity change, appetite change, fatigue  and unexpected weight change.  HENT: Positive for congestion, rhinorrhea, sneezing and postnasal drip.   Eyes: Negative for visual disturbance.  Respiratory: Positive for cough. Negative for shortness of breath.   Cardiovascular: Negative for chest pain, palpitations and leg swelling.  Gastrointestinal: Negative for abdominal pain and abdominal distention.  Genitourinary: Negative for dysuria, urgency and difficulty urinating.  Musculoskeletal: Negative for arthralgias and gait problem.  Skin: Positive for color change and wound. Negative for rash.  Hematological: Negative for adenopathy.  Psychiatric/Behavioral: Negative for disturbed wake/sleep cycle and dysphoric mood. The patient is not nervous/anxious.        Objective:   Physical Exam  Constitutional: He is oriented to person, place, and time. He appears well-developed and well-nourished. No distress.  HENT:  Head: Normocephalic and atraumatic.  Right Ear: External ear normal.  Left Ear: External ear normal.  Nose: Nose normal.  Mouth/Throat: Oropharynx is clear and moist. No oropharyngeal exudate.  Eyes: Conjunctivae normal and EOM are normal. Pupils are equal, round, and reactive to light. Right eye exhibits no discharge. Left eye exhibits no discharge. No scleral icterus.  Neck: Normal range of motion. Neck supple. Carotid bruit is present (right). No tracheal deviation present. No thyromegaly present.  Cardiovascular: Normal rate, regular rhythm and normal heart sounds.  Exam reveals no  gallop and no friction rub.   No murmur heard. Pulmonary/Chest: Effort normal. No accessory muscle usage. Not tachypneic. No respiratory distress. He has no decreased breath sounds. He has no wheezes. He has rhonchi (scattered). He has no rales. He exhibits no tenderness.  Musculoskeletal: Normal range of motion. He exhibits no edema.  Lymphadenopathy:    He has no cervical adenopathy.  Neurological: He is alert and oriented to person, place,  and time. No cranial nerve deficit. Coordination normal.  Skin: Skin is warm and dry. No rash noted. He is not diaphoretic. No erythema. No pallor.     Psychiatric: He has a normal mood and affect. His behavior is normal. Judgment and thought content normal.          Assessment & Plan:

## 2012-06-27 NOTE — Assessment & Plan Note (Signed)
Patient injured his left upper leg several weeks ago in an accident with a skill saw. Laceration was sutured and has healed well except for the upper portion of the wound, at which he tried to remove some embedded stitches himself. This area appears dehisced with some purulent drainage today. Will treat with Keflex and topical gentamicin ointment. Patient will followup in 4 days. If no improvement, will set up referral to wound healing Center.

## 2012-06-27 NOTE — Assessment & Plan Note (Signed)
Symptoms of nasal congestion and productive cough are most consistent with viral upper respiratory infection and perhaps early bronchitis. Will treat with Keflex and azithromycin. He has numerous sensitivities to antibiotics but his daughter reports that he has tolerated Keflex in the past and she does not recall a sensitivity to azithromycin. She will call if any reaction/sensitivities occur. CXR today normal. Follow up 4 days for recheck.

## 2012-06-27 NOTE — Telephone Encounter (Signed)
[name] is working on trying to move some patients, so that we can add [name] in today. I would like to have her get a CXR at [facility] prior to her visit.

## 2012-06-27 NOTE — Telephone Encounter (Signed)
Spoke with Jerald Kief daughter she will take patient to get cxr and bring patient in afterwards at 2:00.

## 2012-07-01 ENCOUNTER — Telehealth: Payer: Self-pay | Admitting: Internal Medicine

## 2012-07-01 ENCOUNTER — Encounter (INDEPENDENT_AMBULATORY_CARE_PROVIDER_SITE_OTHER): Payer: 59

## 2012-07-01 DIAGNOSIS — R0989 Other specified symptoms and signs involving the circulatory and respiratory systems: Secondary | ICD-10-CM

## 2012-07-01 DIAGNOSIS — I6529 Occlusion and stenosis of unspecified carotid artery: Secondary | ICD-10-CM

## 2012-07-01 NOTE — Telephone Encounter (Signed)
They should stop the Zpack. If symptoms are persistent, then he will need to be seen. Can you clarify with them how he is doing?

## 2012-07-01 NOTE — Telephone Encounter (Signed)
Spoke with Chyrl Civatte and she stated that [facility] stated that he is doing better, his BP is better and heart rate up to 59.  She will keep [place] posted.

## 2012-07-01 NOTE — Telephone Encounter (Signed)
[name] called to let you know that [name] is doing better.  His leg is doing also But she wanted to let you know that he is allergic to the zpak.   Dizzy diarrhea head feels weird.  bp ok heart rate 53 Sent triage

## 2012-07-23 ENCOUNTER — Ambulatory Visit: Payer: 59 | Admitting: Internal Medicine

## 2012-08-01 ENCOUNTER — Encounter: Payer: Self-pay | Admitting: Internal Medicine

## 2012-08-01 ENCOUNTER — Ambulatory Visit (INDEPENDENT_AMBULATORY_CARE_PROVIDER_SITE_OTHER): Payer: 59 | Admitting: Internal Medicine

## 2012-08-01 VITALS — BP 160/80 | HR 78 | Temp 98.3°F | Resp 15 | Wt 156.5 lb

## 2012-08-01 DIAGNOSIS — N189 Chronic kidney disease, unspecified: Secondary | ICD-10-CM

## 2012-08-01 DIAGNOSIS — D649 Anemia, unspecified: Secondary | ICD-10-CM

## 2012-08-01 DIAGNOSIS — I10 Essential (primary) hypertension: Secondary | ICD-10-CM

## 2012-08-01 DIAGNOSIS — E785 Hyperlipidemia, unspecified: Secondary | ICD-10-CM

## 2012-08-01 LAB — CBC WITH DIFFERENTIAL/PLATELET
Basophils Absolute: 0 10*3/uL (ref 0.0–0.1)
Basophils Relative: 1 % (ref 0–1)
Eosinophils Absolute: 0.1 10*3/uL (ref 0.0–0.7)
HCT: 31.3 % — ABNORMAL LOW (ref 39.0–52.0)
Hemoglobin: 10.2 g/dL — ABNORMAL LOW (ref 13.0–17.0)
MCH: 28.3 pg (ref 26.0–34.0)
MCHC: 32.6 g/dL (ref 30.0–36.0)
Monocytes Absolute: 0.4 10*3/uL (ref 0.1–1.0)
Monocytes Relative: 9 % (ref 3–12)
Neutrophils Relative %: 57 % (ref 43–77)
RDW: 16.1 % — ABNORMAL HIGH (ref 11.5–15.5)

## 2012-08-01 NOTE — Progress Notes (Signed)
  Subjective:    Patient ID: [name], male    DOB: June 02, 1911, 78 y.o.   MRN: [id number]  HPI 78YO male with HTN presents for follow up. At last visit, had complained of noise in right ear. This has resolved. He reports that carotid doppler was normal. He denies any recurrent ringing in ears, neck pain, chest pain, dyspnea, cough, fever, chills.  He reports good appetite and no change in bowel movements.  He denies any new concerns today.  Outpatient Encounter Prescriptions as of 08/01/2012  Medication Sig Dispense Refill  . hydrALAZINE (APRESOLINE) 25 MG tablet TAKE 1 TABLET FOUR TIMES A DAY  360 tablet  3  . furosemide (LASIX) 20 MG tablet Take 1 tablet (20 mg total) by mouth daily as needed.  30 tablet  6   BP 160/80  Pulse 78  Temp 98.3 F (36.8 C) (Oral)  Resp 15  Wt 156 lb 8 oz (70.988 kg)  Review of Systems  Constitutional: Negative for fever, chills, activity change, appetite change, fatigue and unexpected weight change.  Eyes: Negative for visual disturbance.  Respiratory: Negative for cough and shortness of breath.   Cardiovascular: Negative for chest pain, palpitations and leg swelling.  Gastrointestinal: Negative for abdominal pain and abdominal distention.  Genitourinary: Negative for dysuria, urgency and difficulty urinating.  Musculoskeletal: Negative for arthralgias and gait problem.  Skin: Negative for color change and rash.  Hematological: Negative for adenopathy.  Psychiatric/Behavioral: Negative for sleep disturbance and dysphoric mood. The patient is not nervous/anxious.        Objective:   Physical Exam  Constitutional: He is oriented to person, place, and time. He appears well-developed and well-nourished. No distress.  HENT:  Head: Normocephalic and atraumatic.  Right Ear: External ear normal.  Left Ear: External ear normal.  Nose: Nose normal.  Mouth/Throat: Oropharynx is clear and moist. No oropharyngeal exudate.  Eyes: Conjunctivae normal and EOM  are normal. Pupils are equal, round, and reactive to light. Right eye exhibits no discharge. Left eye exhibits no discharge. No scleral icterus.  Neck: Normal range of motion. Neck supple. No tracheal deviation present. No thyromegaly present.  Cardiovascular: Normal rate, regular rhythm and normal heart sounds.  Exam reveals no gallop and no friction rub.   No murmur heard. Pulmonary/Chest: Effort normal and breath sounds normal. No respiratory distress. He has no wheezes. He has no rales. He exhibits no tenderness.  Musculoskeletal: Normal range of motion. He exhibits no edema.  Lymphadenopathy:    He has no cervical adenopathy.  Neurological: He is alert and oriented to person, place, and time. No cranial nerve deficit. Coordination normal.  Skin: Skin is warm and dry. No rash noted. He is not diaphoretic. No erythema. No pallor.  Psychiatric: He has a normal mood and affect. His behavior is normal. Judgment and thought content normal.          Assessment & Plan:

## 2012-08-01 NOTE — Assessment & Plan Note (Signed)
Blood pressure well controlled with hydralazine. Will continue current meds. Follow up in 6 months and prn.

## 2012-08-01 NOTE — Assessment & Plan Note (Signed)
Will check renal function with labs today. Follow up 6 months and prn.

## 2012-08-02 LAB — LIPID PANEL
Cholesterol: 151 mg/dL (ref 0–200)
HDL: 44 mg/dL (ref >39)
Triglycerides: 165 mg/dL — ABNORMAL HIGH (ref <150)

## 2012-08-02 LAB — COMPREHENSIVE METABOLIC PANEL
BUN: 39 mg/dL — ABNORMAL HIGH (ref 6–23)
CO2: 21 mEq/L (ref 19–32)
Calcium: 9.1 mg/dL (ref 8.4–10.5)
Chloride: 112 mEq/L (ref 96–112)
Creat: 2.31 mg/dL — ABNORMAL HIGH (ref 0.50–1.35)
Glucose, Bld: 116 mg/dL — ABNORMAL HIGH (ref 70–99)
Total Bilirubin: 0.3 mg/dL (ref 0.3–1.2)

## 2012-08-04 ENCOUNTER — Ambulatory Visit: Payer: 59 | Admitting: Internal Medicine

## 2012-12-12 ENCOUNTER — Ambulatory Visit: Payer: 59 | Admitting: Cardiovascular Disease

## 2012-12-18 ENCOUNTER — Ambulatory Visit (INDEPENDENT_AMBULATORY_CARE_PROVIDER_SITE_OTHER): Payer: 59 | Admitting: Cardiovascular Disease

## 2012-12-18 ENCOUNTER — Encounter: Payer: Self-pay | Admitting: Cardiovascular Disease

## 2012-12-18 VITALS — BP 140/58 | HR 63 | Ht 64.0 in | Wt 149.8 lb

## 2012-12-18 DIAGNOSIS — R42 Dizziness and giddiness: Secondary | ICD-10-CM

## 2012-12-18 DIAGNOSIS — I10 Essential (primary) hypertension: Secondary | ICD-10-CM

## 2012-12-18 DIAGNOSIS — I6529 Occlusion and stenosis of unspecified carotid artery: Secondary | ICD-10-CM | POA: Insufficient documentation

## 2012-12-18 DIAGNOSIS — I6523 Occlusion and stenosis of bilateral carotid arteries: Secondary | ICD-10-CM

## 2012-12-18 DIAGNOSIS — N189 Chronic kidney disease, unspecified: Secondary | ICD-10-CM

## 2012-12-18 DIAGNOSIS — R609 Edema, unspecified: Secondary | ICD-10-CM

## 2012-12-18 DIAGNOSIS — I658 Occlusion and stenosis of other precerebral arteries: Secondary | ICD-10-CM

## 2012-12-18 NOTE — Progress Notes (Signed)
Patient ID: [name], male    DOB: 1911-07-04, 79 y.o.   MRN: [id number]  HPI Comments: [name] is a 79 year old gentleman with a history of chronic hypertension, renal dysfunction, who presented to the hospital on March 06 2012 with bradycardia, severe hypertension. Cardiology was consulted to manage his blood pressure. He was discharged on hydralazine t.i.d.  Today he reports that his blood pressure at home has been well controlled. Systolic pressure typically in the 130 range. Heart rate is in the 50-70 range. He does report having prostate issues and has polyuria at night time with urine retention.  Prior issues with swelling of his penis He currently takes hydralazine 4 times a day No significant leg swelling. Takes Lasix when necessary.  Overall he reports that he feels well. Family reports that his balance is okay. Is not using a cane or walker. He is requiring significant help from his family during the daytime. Wife died several days ago. He is looking forward to working in his garden.  He does report having swelling in his left leg for quite some time. No swelling in the right leg. This was there prior to his recent accident with the saw. Ultrasound in the hospital 05/18/2012 showed no DVT.  He denies any shortness of breath or chest pain. Otherwise has no complaints. He likes to work in his garden.  Previous Echo showed normal systolic function, EF >55%, mild to moderately elevated right ventricular systolic pressures, diastolic dysfunction  EKG shows normal sinus rhythm with rate 63 beats per minute, left axis deviation, nonspecific ST abnormality in leads V4 through V6, 3, aVF    Outpatient Encounter Prescriptions as of 12/18/2012  Medication Sig Dispense Refill  . furosemide (LASIX) 20 MG tablet Take 1 tablet (20 mg total) by mouth daily as needed.  30 tablet  6  . hydrALAZINE (APRESOLINE) 25 MG tablet TAKE 1 TABLET FOUR TIMES A DAY  360 tablet  3   No facility-administered  encounter medications on file as of 12/18/2012.     Review of Systems  Constitutional: Negative.   HENT: Negative.   Eyes: Negative.   Respiratory: Negative.   Gastrointestinal: Negative.   Genitourinary: Positive for difficulty urinating.  Musculoskeletal: Positive for gait problem.  Skin: Negative.   Neurological: Negative.   Psychiatric/Behavioral: Negative.   All other systems reviewed and are negative.   BP 140/58  Pulse 63  Ht 5\' 4"  (1.626 m)  Wt 149 lb 12 oz (67.926 kg)  BMI 25.69 kg/m2  Physical Exam  Nursing note and vitals reviewed. Constitutional: He is oriented to person, place, and time. He appears well-developed and well-nourished.  Elderly gentleman   HENT:  Head: Normocephalic.  Nose: Nose normal.  Mouth/Throat: Oropharynx is clear and moist.  Eyes: Conjunctivae are normal. Pupils are equal, round, and reactive to light.  Neck: Normal range of motion. Neck supple. No JVD present.  Cardiovascular: Normal rate, regular rhythm, S1 normal, S2 normal and intact distal pulses.  Exam reveals no gallop and no friction rub.   Murmur heard.  Crescendo systolic murmur is present with a grade of 2/6  Pulmonary/Chest: Effort normal and breath sounds normal. No respiratory distress. He has no wheezes. He has no rales. He exhibits no tenderness.  Abdominal: Soft. Bowel sounds are normal. He exhibits no distension. There is no tenderness.  Musculoskeletal: Normal range of motion. He exhibits no edema and no tenderness.  Lymphadenopathy:    He has no cervical adenopathy.  Neurological:  He is alert and oriented to person, place, and time. Coordination normal.  Skin: Skin is warm and dry. No rash noted. No erythema.  Psychiatric: He has a normal mood and affect. His behavior is normal. Judgment and thought content normal.      Assessment and Plan

## 2012-12-18 NOTE — Assessment & Plan Note (Addendum)
60-70% disease on the left, 40-50% disease on the right.  monitor this on an annual basis. Aspirin 81 mg daily Cholesterol is actually within a reasonable range Will discuss with him on the next visit whether he would like low-dose statin

## 2012-12-18 NOTE — Assessment & Plan Note (Signed)
Did not complain of much dizziness on today's visit. Gait is fair per the family

## 2012-12-18 NOTE — Assessment & Plan Note (Signed)
Blood pressure is well controlled on today's visit. No changes made to the medications. 

## 2012-12-18 NOTE — Assessment & Plan Note (Signed)
Prior edema from diastolic CHF. Now resolved. Takes Lasix when necessary.

## 2012-12-18 NOTE — Patient Instructions (Addendum)
You are doing well. No medication changes were made.  Please call us if you have new issues that need to be addressed before your next appt.  Your physician wants you to follow-up in: 6 months.  You will receive a reminder letter in the mail two months in advance. If you don't receive a letter, please call our office to schedule the follow-up appointment.   

## 2013-01-30 ENCOUNTER — Encounter: Payer: Self-pay | Admitting: Internal Medicine

## 2013-01-30 ENCOUNTER — Ambulatory Visit (INDEPENDENT_AMBULATORY_CARE_PROVIDER_SITE_OTHER): Payer: 59 | Admitting: Internal Medicine

## 2013-01-30 VITALS — BP 160/64 | HR 62 | Temp 98.1°F | Ht 64.0 in | Wt 146.0 lb

## 2013-01-30 DIAGNOSIS — I10 Essential (primary) hypertension: Secondary | ICD-10-CM

## 2013-01-30 DIAGNOSIS — Z0000 Encounter for general adult medical examination without abnormal findings: Secondary | ICD-10-CM

## 2013-01-30 LAB — CBC WITH DIFFERENTIAL/PLATELET
Basophils Relative: 0.9 % (ref 0.0–3.0)
Eosinophils Absolute: 0.1 10*3/uL (ref 0.0–0.7)
Eosinophils Relative: 1.7 % (ref 0.0–5.0)
Hemoglobin: 10.5 g/dL — ABNORMAL LOW (ref 13.0–17.0)
Lymphocytes Relative: 23.6 % (ref 12.0–46.0)
MCHC: 33.5 g/dL (ref 30.0–36.0)
Neutro Abs: 3.4 10*3/uL (ref 1.4–7.7)
Neutrophils Relative %: 66.6 % (ref 43.0–77.0)
RBC: 3.55 Mil/uL — ABNORMAL LOW (ref 4.22–5.81)
WBC: 5.1 10*3/uL (ref 4.5–10.5)

## 2013-01-30 LAB — COMPREHENSIVE METABOLIC PANEL
AST: 17 U/L (ref 0–37)
Albumin: 3.8 g/dL (ref 3.5–5.2)
Alkaline Phosphatase: 54 U/L (ref 39–117)
BUN: 49 mg/dL — ABNORMAL HIGH (ref 6–23)
Calcium: 9.2 mg/dL (ref 8.4–10.5)
Creatinine, Ser: 2.5 mg/dL — ABNORMAL HIGH (ref 0.4–1.5)
Glucose, Bld: 92 mg/dL (ref 70–99)

## 2013-01-30 LAB — LIPID PANEL
Cholesterol: 148 mg/dL (ref 0–200)
LDL Cholesterol: 74 mg/dL (ref 0–99)
Triglycerides: 150 mg/dL — ABNORMAL HIGH (ref 0.0–149.0)
VLDL: 30 mg/dL (ref 0.0–40.0)

## 2013-01-30 MED ORDER — HYDRALAZINE HCL 25 MG PO TABS: ORAL_TABLET | ORAL | Status: DC

## 2013-01-30 NOTE — Assessment & Plan Note (Addendum)
General medical exam normal today. Patient currently lives independently and is functioning well with support from his children. He is physically active and eats a healthy diet. He has not had any recent falls. We discussed falls prevention. He is up-to-date on health maintenance. Will check CBC, CMP, and lipid profile with labs today. He has chronic hearing loss, but declines referral for use of hearing aids.

## 2013-01-30 NOTE — Progress Notes (Signed)
Subjective:    Patient ID: [name], male    DOB: 18-Aug-1911, 79 y.o.   MRN: [id number]  HPI The patient is here for annual Medicare wellness examination and management of other chronic and acute problems.   The risk factors are reflected in the social history.  The roster of all physicians providing medical care to patient - is listed in the Snapshot section of the chart.  Activities of daily living:  The patient is 100% independent in all ADLs: dressing, toileting, feeding as well as independent mobility. Daughters help to provide some care during day. Pt prepares breakfast, daughters prepare other foods. He does not drive.  Home safety : The patient has smoke detectors in the home. They wear seatbelts.  There are no firearms at home. There is no violence in the home.   There is no risks for hepatitis, STDs or HIV. There is no history of blood transfusion. They have no travel history to infectious disease endemic areas of the world.  The patient has not seen their dentist in the last six month. Wears dentures. They have seen their eye doctor in the last year. [facility]. Chronic hearing loss, declined hearing aids.  They have deferred audiologic testing in the last year.   They do not  have excessive sun exposure. Discussed the need for sun protection: hats, long sleeves and use of sunscreen if there is significant sun exposure. Dermatologist - Dr. Orson Aloe.  Diet: the importance of a healthy diet is discussed. They do have a healthy diet.  The benefits of regular aerobic exercise were discussed. He walks and gardens.  Depression screen: there are no signs or vegative symptoms of depression- irritability, change in appetite, anhedonia, sadness/tearfullness.  Cognitive assessment: the patient manages all their financial and personal affairs and is actively engaged. They could relate day,date,year and events.  The following portions of the patient's history were reviewed and  updated as appropriate: allergies, current medications, past family history, past medical history,  past surgical history, past social history  and problem list.  Visual acuity was not assessed per patient preference since he has regular follow up with her ophthalmologist. Hearing and body mass index were assessed and reviewed.   During the course of the visit the patient was educated and counseled about appropriate screening and preventive services including : fall prevention , diabetes screening, nutrition counseling, colorectal cancer screening, and recommended immunizations.    Outpatient Encounter Prescriptions as of 01/30/2013  Medication Sig Dispense Refill  . hydrALAZINE (APRESOLINE) 25 MG tablet TAKE 1 TABLET FOUR TIMES A DAY  360 tablet  3  . [DISCONTINUED] hydrALAZINE (APRESOLINE) 25 MG tablet TAKE 1 TABLET FOUR TIMES A DAY  360 tablet  3  . furosemide (LASIX) 20 MG tablet Take 1 tablet (20 mg total) by mouth daily as needed.  30 tablet  6   No facility-administered encounter medications on file as of 01/30/2013.   BP 160/64  Pulse 62  Temp(Src) 98.1 F (36.7 C) (Oral)  Ht 5\' 4"  (1.626 m)  Wt 146 lb (66.225 kg)  BMI 25.05 kg/m2  SpO2 98%   Review of Systems  Constitutional: Negative for fever, chills, activity change, appetite change, fatigue and unexpected weight change.  Eyes: Negative for visual disturbance.  Respiratory: Negative for cough and shortness of breath.   Cardiovascular: Negative for chest pain, palpitations and leg swelling.  Gastrointestinal: Negative for abdominal pain and abdominal distention.  Genitourinary: Negative for dysuria, urgency and difficulty urinating.  Musculoskeletal: Negative for arthralgias and gait problem.  Skin: Negative for color change and rash.  Hematological: Negative for adenopathy.  Psychiatric/Behavioral: Negative for sleep disturbance and dysphoric mood. The patient is not nervous/anxious.        Objective:   Physical Exam   Constitutional: He is oriented to person, place, and time. He appears well-developed and well-nourished. No distress.  HENT:  Head: Normocephalic and atraumatic.  Right Ear: External ear normal. Decreased hearing is noted.  Left Ear: External ear normal. Decreased hearing is noted.  Nose: Nose normal.  Mouth/Throat: Oropharynx is clear and moist. No oropharyngeal exudate.  Eyes: Conjunctivae and EOM are normal. Pupils are equal, round, and reactive to light. Right eye exhibits no discharge. Left eye exhibits no discharge. No scleral icterus.  Neck: Normal range of motion. Neck supple. No tracheal deviation present. No thyromegaly present.  Cardiovascular: Normal rate, regular rhythm and normal heart sounds.  Exam reveals no gallop and no friction rub.   No murmur heard. Pulmonary/Chest: Effort normal and breath sounds normal. No respiratory distress. He has no wheezes. He has no rales. He exhibits no tenderness.  Musculoskeletal: Normal range of motion. He exhibits no edema.  Lymphadenopathy:    He has no cervical adenopathy.  Neurological: He is alert and oriented to person, place, and time. No cranial nerve deficit. Coordination normal.  Skin: Skin is warm and dry. No rash noted. He is not diaphoretic. No erythema. No pallor.  Psychiatric: He has a normal mood and affect. His behavior is normal. Judgment and thought content normal.          Assessment & Plan:

## 2013-02-03 ENCOUNTER — Encounter: Payer: Self-pay | Admitting: *Deleted

## 2013-06-25 ENCOUNTER — Encounter: Payer: Self-pay | Admitting: Cardiovascular Disease

## 2013-06-25 ENCOUNTER — Ambulatory Visit (INDEPENDENT_AMBULATORY_CARE_PROVIDER_SITE_OTHER): Payer: 59 | Admitting: Cardiovascular Disease

## 2013-06-25 VITALS — BP 150/60 | HR 71 | Ht 64.0 in | Wt 148.5 lb

## 2013-06-25 DIAGNOSIS — I10 Essential (primary) hypertension: Secondary | ICD-10-CM

## 2013-06-25 DIAGNOSIS — I6529 Occlusion and stenosis of unspecified carotid artery: Secondary | ICD-10-CM

## 2013-06-25 DIAGNOSIS — R269 Unspecified abnormalities of gait and mobility: Secondary | ICD-10-CM

## 2013-06-25 DIAGNOSIS — R2681 Unsteadiness on feet: Secondary | ICD-10-CM

## 2013-06-25 DIAGNOSIS — I658 Occlusion and stenosis of other precerebral arteries: Secondary | ICD-10-CM

## 2013-06-25 DIAGNOSIS — I6523 Occlusion and stenosis of bilateral carotid arteries: Secondary | ICD-10-CM

## 2013-06-25 DIAGNOSIS — N189 Chronic kidney disease, unspecified: Secondary | ICD-10-CM

## 2013-06-25 NOTE — Progress Notes (Signed)
Patient ID: [name], male    DOB: [birth year], 79 y.o.   MRN: [id number]  HPI Comments: [name] is a 79 year old gentleman with a history of chronic hypertension, renal dysfunction, who presented to the hospital on March 06 2012 with bradycardia, severe hypertension. Cardiology was consulted to manage his blood pressure. He was discharged on hydralazine t.i.d.  In general he reports that he has been doing well. His gait is weaker with more unsteadiness per his son who presents with him today. Periodic incontinence, insomnia. Reports blood pressure is well controlled. He does report having prostate issues and has polyuria at night time with urine retention.  Prior issues with swelling of his penis He currently takes hydralazine 4 times a day No significant leg swelling. Takes Lasix when necessary.  Is not using a cane or walker. He is requiring significant help from his family during the daytime. Wife died several days ago. He is  working in his garden.  He does report having swelling in his left leg for quite some time. No swelling in the right leg. This was there prior to his recent accident with the saw. Ultrasound in the hospital 05/18/2012 showed no DVT.   Otherwise has no complaints.   Previous Echo showed normal systolic function, EF >55%, mild to moderately elevated right ventricular systolic pressures, diastolic dysfunction  EKG shows normal sinus rhythm with rate 71 beats per minute, left axis deviation, nonspecific ST abnormality in leads V4 through V6, 3, aVF    Outpatient Encounter Prescriptions as of 06/25/2013  Medication Sig Dispense Refill  . cholecalciferol (VITAMIN D) 1000 UNITS tablet Take 1,000 Units by mouth daily.      . ferrous fumarate (HEMOCYTE - 106 MG FE) 325 (106 FE) MG TABS tablet Take 1 tablet by mouth daily.      . furosemide (LASIX) 20 MG tablet Take 1 tablet (20 mg total) by mouth daily as needed.  30 tablet  6  . hydrALAZINE (APRESOLINE) 25 MG tablet  TAKE 1 TABLET FOUR TIMES A DAY  360 tablet  3  . Multiple Vitamin (MULTIVITAMIN) tablet Take 1 tablet by mouth daily.      . Multiple Vitamins-Minerals (ZINC PO) Take by mouth daily.      Marland Kitchen VITAMIN A PO Take by mouth daily.      . vitamin E 400 UNIT capsule Take 400 Units by mouth daily.       No facility-administered encounter medications on file as of 06/25/2013.     Review of Systems  Constitutional: Negative.   HENT: Negative.   Eyes: Negative.   Respiratory: Negative.   Cardiovascular: Negative.   Gastrointestinal: Negative.   Endocrine: Negative.   Genitourinary: Positive for difficulty urinating.  Musculoskeletal: Positive for gait problem.  Skin: Negative.   Allergic/Immunologic: Negative.   Neurological: Negative.   Hematological: Negative.   Psychiatric/Behavioral: Negative.   All other systems reviewed and are negative.   BP 150/60  Pulse 71  Ht 5\' 4"  (1.626 m)  Wt 148 lb 8 oz (67.359 kg)  BMI 25.48 kg/m2  Physical Exam  Nursing note and vitals reviewed. Constitutional: He is oriented to person, place, and time. He appears well-developed and well-nourished.  Elderly gentleman   HENT:  Head: Normocephalic.  Nose: Nose normal.  Mouth/Throat: Oropharynx is clear and moist.  Eyes: Conjunctivae are normal. Pupils are equal, round, and reactive to light.  Neck: Normal range of motion. Neck supple. No JVD present.  Cardiovascular: Normal rate, regular rhythm,  S1 normal, S2 normal and intact distal pulses.  Exam reveals no gallop and no friction rub.   Murmur heard.  Crescendo systolic murmur is present with a grade of 2/6  Pulmonary/Chest: Effort normal and breath sounds normal. No respiratory distress. He has no wheezes. He has no rales. He exhibits no tenderness.  Abdominal: Soft. Bowel sounds are normal. He exhibits no distension. There is no tenderness.  Musculoskeletal: Normal range of motion. He exhibits no edema and no tenderness.  Lymphadenopathy:    He  has no cervical adenopathy.  Neurological: He is alert and oriented to person, place, and time. Coordination normal.  Skin: Skin is warm and dry. No rash noted. No erythema.  Psychiatric: He has a normal mood and affect. His behavior is normal. Judgment and thought content normal.      Assessment and Plan

## 2013-06-25 NOTE — Assessment & Plan Note (Signed)
Encouraged him to use a cane, even a walker when outside if needed for long distances

## 2013-06-25 NOTE — Patient Instructions (Signed)
You are doing well. No medication changes were made.  Please call us if you have new issues that need to be addressed before your next appt.  Your physician wants you to follow-up in: 6 months.  You will receive a reminder letter in the mail two months in advance. If you don't receive a letter, please call our office to schedule the follow-up appointment.   

## 2013-06-25 NOTE — Assessment & Plan Note (Signed)
Creatinine appears to be running at its baseline of 2.5. Encouraged some fluid intake.

## 2013-06-25 NOTE — Assessment & Plan Note (Signed)
Blood pressure is well controlled on today's visit. No changes made to the medications. 

## 2013-06-25 NOTE — Assessment & Plan Note (Signed)
50% disease on the right, 60% disease on the left. We'll discuss with him whether he would like a repeat carotid ultrasound on his next visit. Currently not on a statin.

## 2013-07-02 ENCOUNTER — Ambulatory Visit (INDEPENDENT_AMBULATORY_CARE_PROVIDER_SITE_OTHER): Payer: 59 | Admitting: Podiatrist

## 2013-07-02 ENCOUNTER — Encounter: Payer: Self-pay | Admitting: Podiatrist

## 2013-07-02 VITALS — BP 149/75 | HR 75 | Resp 16 | Ht 63.0 in | Wt 155.0 lb

## 2013-07-02 DIAGNOSIS — B351 Tinea unguium: Secondary | ICD-10-CM

## 2013-07-02 DIAGNOSIS — M79609 Pain in unspecified limb: Secondary | ICD-10-CM

## 2013-07-02 NOTE — Patient Instructions (Signed)
Call if you have any problems or concerns!

## 2013-07-04 IMAGING — CR DG CHEST 2V
1 series · 2 of 2 positions shown · non-contrast
Comparison: none

REASON FOR EXAM: productive cough
COMMENTS:

[Series 1: x chest ap · 0.14mm/px · 2 of 2 slices shown]
[im 1/2]
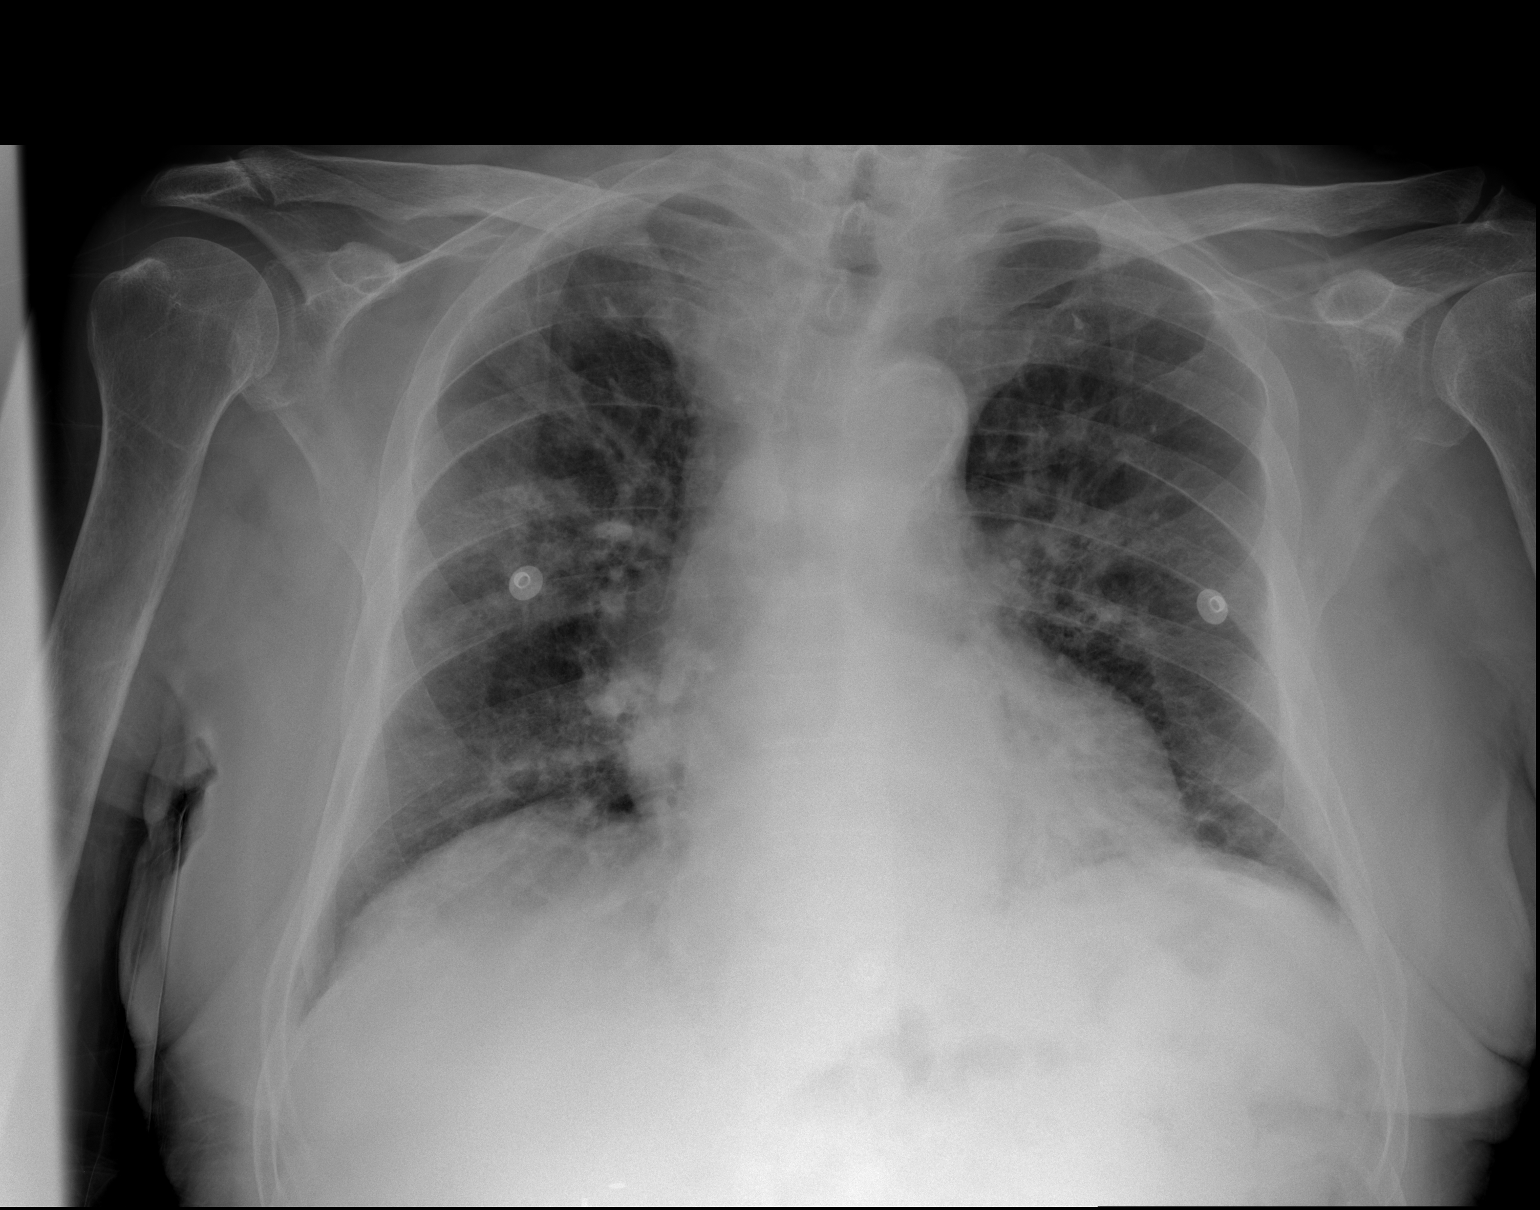
[im 2/2]
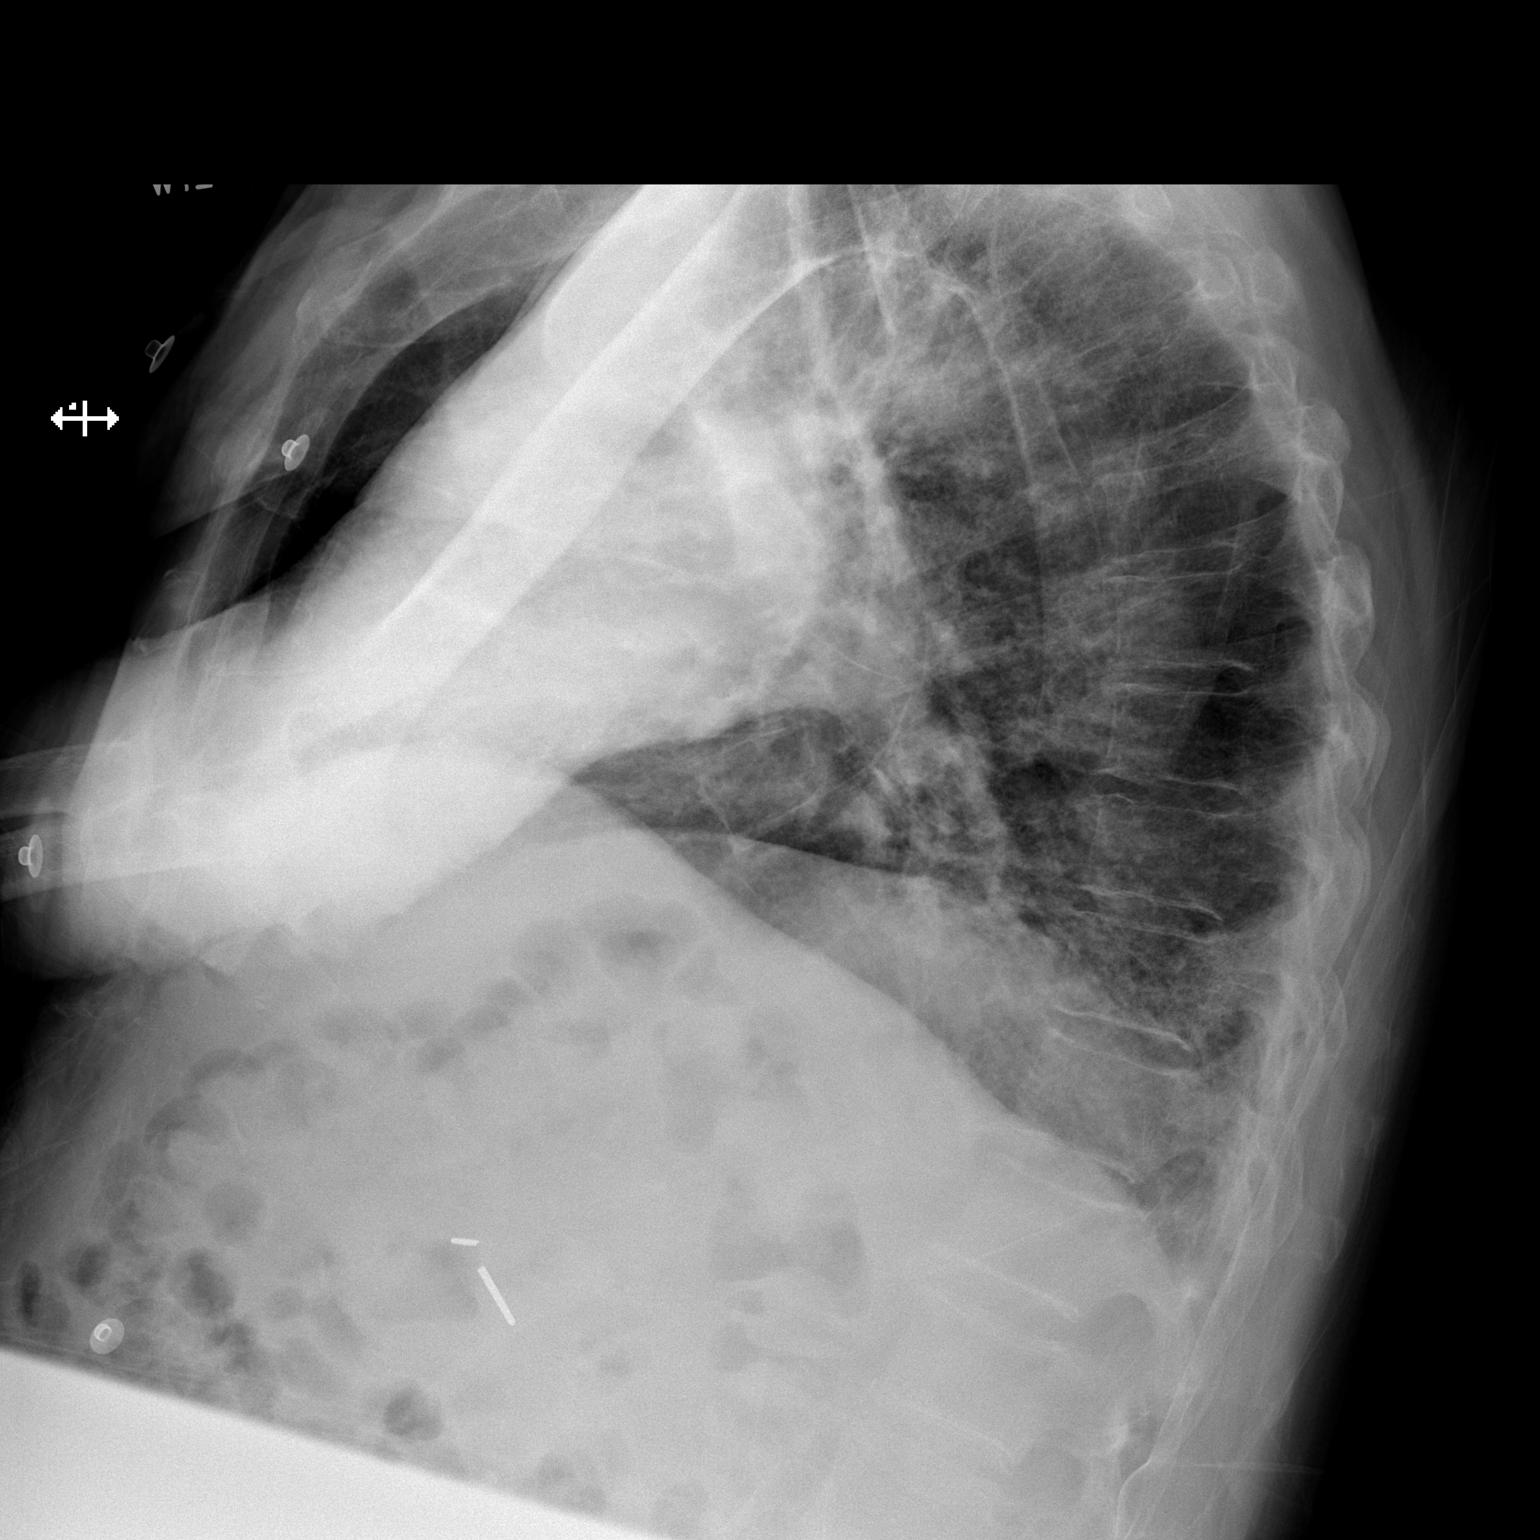

[2 of 2 positions shown; findings below may reference images not displayed]

PROCEDURE:     DXR - DXR CHEST PA (OR AP) AND LATERAL  - February 08, 2012  [DATE]

RESULT:     Comparison is made to the prior exam of 03/07/2011.

There is a patchy increase in density about the hilar regions bilaterally.
Pneumonia would be the first consideration as to etiology. Additionally,
there appears to be minimal infiltrate at the left base. No acute changes of
the heart or pulmonary vasculature are seen.
IMPRESSION: 1.  There are patchy perihilar infiltrates bilaterally suspicious for
pneumonia.
2.  There is also noted a minimal increase in density at the left base
compatible with atelectasis or pneumonia.
3.  The heart size is normal.
4.  Follow-up examination until clear is recommended.

[REDACTED]

## 2013-07-08 NOTE — Progress Notes (Signed)
HPI:  Patient presents today for follow up of foot and nail care. Denies any new complaints today.  Objective:  Patients chart is reviewed.  Neurovascular status unchanged.  Patients nails are thickened, discolored, distrophic, friable and brittle with yellow-brown discoloration. Patient subjectively relates they are painful with shoes and with ambulation.both feet 1-5  Assessment:  Symptomatic onychomycosis  Plan:  Discussed treatment options and alternatives.  The symptomatic toenails were debrided through manual an mechanical means without complication.  Return appointment recommended at routine intervals of 3 months

## 2013-07-30 ENCOUNTER — Encounter (INDEPENDENT_AMBULATORY_CARE_PROVIDER_SITE_OTHER): Payer: Self-pay

## 2013-07-30 ENCOUNTER — Encounter: Payer: Self-pay | Admitting: Internal Medicine

## 2013-07-30 ENCOUNTER — Ambulatory Visit (INDEPENDENT_AMBULATORY_CARE_PROVIDER_SITE_OTHER): Payer: 59 | Admitting: Internal Medicine

## 2013-07-30 VITALS — BP 156/70 | HR 90 | Temp 98.3°F | Wt 151.0 lb

## 2013-07-30 DIAGNOSIS — Z23 Encounter for immunization: Secondary | ICD-10-CM

## 2013-07-30 DIAGNOSIS — L989 Disorder of the skin and subcutaneous tissue, unspecified: Secondary | ICD-10-CM

## 2013-07-30 DIAGNOSIS — I10 Essential (primary) hypertension: Secondary | ICD-10-CM

## 2013-07-30 DIAGNOSIS — N189 Chronic kidney disease, unspecified: Secondary | ICD-10-CM

## 2013-07-30 DIAGNOSIS — D649 Anemia, unspecified: Secondary | ICD-10-CM

## 2013-07-30 LAB — CBC WITH DIFFERENTIAL/PLATELET
Basophils Absolute: 0 10*3/uL (ref 0.0–0.1)
Eosinophils Absolute: 0.1 10*3/uL (ref 0.0–0.7)
HCT: 31.3 % — ABNORMAL LOW (ref 39.0–52.0)
Hemoglobin: 10.5 g/dL — ABNORMAL LOW (ref 13.0–17.0)
Lymphocytes Relative: 24 % (ref 12.0–46.0)
Lymphs Abs: 1.2 10*3/uL (ref 0.7–4.0)
MCHC: 33.6 g/dL (ref 30.0–36.0)
MCV: 91.6 fl (ref 78.0–100.0)
Monocytes Absolute: 0.4 10*3/uL (ref 0.1–1.0)
Neutro Abs: 3.4 10*3/uL (ref 1.4–7.7)
RBC: 3.41 Mil/uL — ABNORMAL LOW (ref 4.22–5.81)
RDW: 15 % — ABNORMAL HIGH (ref 11.5–14.6)

## 2013-07-30 LAB — COMPREHENSIVE METABOLIC PANEL
ALT: 13 U/L (ref 0–53)
AST: 20 U/L (ref 0–37)
BUN: 37 mg/dL — ABNORMAL HIGH (ref 6–23)
CO2: 24 mEq/L (ref 19–32)
Calcium: 9.6 mg/dL (ref 8.4–10.5)
Chloride: 110 mEq/L (ref 96–112)
Creatinine, Ser: 2.3 mg/dL — ABNORMAL HIGH (ref 0.4–1.5)
Sodium: 140 mEq/L (ref 135–145)
Total Bilirubin: 0.6 mg/dL (ref 0.3–1.2)
Total Protein: 6.4 g/dL (ref 6.0–8.3)

## 2013-07-30 NOTE — Progress Notes (Signed)
  Subjective:    Patient ID: [name], male    DOB: Jan 23, 1911, 79 y.o.   MRN: [id number]  HPI 79 year old male with history of hypertension presents for followup. He reports he is generally feeling well. Energy level is good. Appetite is excellent. He denies any concerns today. He is scheduled to see his dermatologist to evaluate skin lesion on the left side of his nose. He is unsure how long this has been present. It is not painful or itchy.  Outpatient Encounter Prescriptions as of 07/30/2013  Medication Sig  . cholecalciferol (VITAMIN D) 1000 UNITS tablet Take 1,000 Units by mouth daily.  . ferrous fumarate (HEMOCYTE - 106 MG FE) 325 (106 FE) MG TABS tablet Take 1 tablet by mouth daily.  . hydrALAZINE (APRESOLINE) 25 MG tablet TAKE 1 TABLET FOUR TIMES A DAY  . Multiple Vitamin (MULTIVITAMIN) tablet Take 1 tablet by mouth daily.  . Multiple Vitamins-Minerals (ZINC PO) Take by mouth daily.  Marland Kitchen VITAMIN A PO Take by mouth daily.  . vitamin E 400 UNIT capsule Take 400 Units by mouth daily.  . furosemide (LASIX) 20 MG tablet Take 1 tablet (20 mg total) by mouth daily as needed.   BP 156/70  Pulse 90  Temp(Src) 98.3 F (36.8 C) (Oral)  Wt 151 lb (68.493 kg)  SpO2 98%  Review of Systems  Constitutional: Negative for fever, chills, activity change, appetite change, fatigue and unexpected weight change.  Eyes: Negative for visual disturbance.  Respiratory: Negative for cough and shortness of breath.   Cardiovascular: Negative for chest pain, palpitations and leg swelling.  Gastrointestinal: Negative for abdominal pain and abdominal distention.  Genitourinary: Negative for dysuria, urgency and difficulty urinating.  Musculoskeletal: Negative for arthralgias and gait problem.  Skin: Negative for color change and rash.  Hematological: Negative for adenopathy.  Psychiatric/Behavioral: Negative for sleep disturbance and dysphoric mood. The patient is not nervous/anxious.        Objective:     Physical Exam  Constitutional: He is oriented to person, place, and time. He appears well-developed and well-nourished. No distress.  HENT:  Head: Normocephalic and atraumatic.    Right Ear: External ear normal. Decreased hearing is noted.  Left Ear: External ear normal. Decreased hearing is noted.  Nose: Nose normal.  Mouth/Throat: Oropharynx is clear and moist. No oropharyngeal exudate.  Eyes: Conjunctivae and EOM are normal. Pupils are equal, round, and reactive to light. Right eye exhibits no discharge. Left eye exhibits no discharge. No scleral icterus.  Neck: Normal range of motion. Neck supple. No tracheal deviation present. No thyromegaly present.  Cardiovascular: Normal rate, regular rhythm and normal heart sounds.  Exam reveals no gallop and no friction rub.   No murmur heard. Pulmonary/Chest: Effort normal and breath sounds normal. No respiratory distress. He has no wheezes. He has no rales. He exhibits no tenderness.  Musculoskeletal: Normal range of motion. He exhibits no edema.  Lymphadenopathy:    He has no cervical adenopathy.  Neurological: He is alert and oriented to person, place, and time. No cranial nerve deficit. Coordination normal.  Skin: Skin is warm and dry. No rash noted. He is not diaphoretic. No erythema. No pallor.  Psychiatric: He has a normal mood and affect. His behavior is normal. Judgment and thought content normal.          Assessment & Plan:

## 2013-07-30 NOTE — Assessment & Plan Note (Signed)
Will recheck CBC with labs today. 

## 2013-07-30 NOTE — Assessment & Plan Note (Signed)
Blood pressure well-controlled with hydralazine. Will continue.

## 2013-07-30 NOTE — Assessment & Plan Note (Addendum)
Skin lesion of left nare most consistent with basal cell carcinoma. Encouraged him to keep scheduled followup with his dermatologist for biopsy.

## 2013-07-30 NOTE — Assessment & Plan Note (Signed)
Will check renal function with labs today. 

## 2013-07-30 NOTE — Progress Notes (Signed)
Pre-visit discussion using our clinic review tool. No additional management support is needed unless otherwise documented below in the visit note.  

## 2013-07-31 ENCOUNTER — Encounter: Payer: Self-pay | Admitting: *Deleted

## 2013-08-05 ENCOUNTER — Ambulatory Visit: Payer: 59 | Admitting: Internal Medicine

## 2013-09-07 ENCOUNTER — Encounter: Payer: Self-pay | Admitting: Internal Medicine

## 2013-09-07 ENCOUNTER — Ambulatory Visit (INDEPENDENT_AMBULATORY_CARE_PROVIDER_SITE_OTHER): Payer: 59 | Admitting: Internal Medicine

## 2013-09-07 VITALS — BP 130/60 | HR 74 | Temp 98.0°F | Resp 18 | Wt 152.0 lb

## 2013-09-07 DIAGNOSIS — J209 Acute bronchitis, unspecified: Secondary | ICD-10-CM

## 2013-09-07 MED ORDER — DOXYCYCLINE MONOHYDRATE 100 MG PO TABS: 100.0000 mg | ORAL_TABLET | Freq: Two times a day (BID) | ORAL | Status: DC

## 2013-09-07 NOTE — Assessment & Plan Note (Signed)
Still may be viral Initial illness 10 days ago Now with some cough Discussed supportive care---cough med, acetaminophen If worsens, doxy

## 2013-09-07 NOTE — Patient Instructions (Signed)
Please use acetaminophen and over the counter cough medicine for now. If you get worse, fill the prescription for the antibiotic doxycycline and begin this.

## 2013-09-07 NOTE — Progress Notes (Signed)
Subjective:    Patient ID: [name], male    DOB: 1910/10/23, 79 y.o.   MRN: [id number]  HPI Here with daughter  Has had bad cough and congestion for 3 days Mostly dry---or occasional clear sputum  No fever No chills or sweats No SOB No ear pain  Went to urgent care 1 week ago---just given flonase Only had nasal congestion at that point and ?serous otitis on exam Hearing is worse  No meds Given flonase at urgent care---not using this (thought it made him worse)  Current Outpatient Prescriptions on File Prior to Visit  Medication Sig Dispense Refill  . cholecalciferol (VITAMIN D) 1000 UNITS tablet Take 1,000 Units by mouth daily.      . ferrous fumarate (HEMOCYTE - 106 MG FE) 325 (106 FE) MG TABS tablet Take 1 tablet by mouth daily.      . hydrALAZINE (APRESOLINE) 25 MG tablet TAKE 1 TABLET FOUR TIMES A DAY  360 tablet  3  . Multiple Vitamin (MULTIVITAMIN) tablet Take 1 tablet by mouth daily.      . Multiple Vitamins-Minerals (ZINC PO) Take by mouth daily.      Marland Kitchen VITAMIN A PO Take by mouth daily.      . vitamin E 400 UNIT capsule Take 400 Units by mouth daily.       No current facility-administered medications on file prior to visit.    Allergies  Allergen Reactions  . Amoxicillin   . Clarithromycin   . Levofloxacin   . Macrodantin   . Metronidazole   . Micardis [Telmisartan]   . Nabumetone   . Neosporin [Neomycin-Polymyxin-Gramicidin]   . Penicillins   . Sulfa Drugs Cross Reactors   . Zithromax [Azithromycin] Diarrhea and Other (See Comments)    Dizzy, headache    Past Medical History  Diagnosis Date  . Chronic kidney disease   . Syncope and collapse June 2012    pre syncopal    Past Surgical History  Procedure Laterality Date  . Prostate surgery    . Intraocular lens implant, secondary  1990    bilateral implant  . Gallbladder surgery    . Hernia repair    . Kidney stone surgery      No family history on file.  History   Social  History  . Marital Status: Married    Spouse Name: N/A    Number of Children: N/A  . Years of Education: N/A   Occupational History  . Not on file.   Social History Main Topics  . Smoking status: Former Smoker -- 1.00 packs/day for 20 years    Types: Cigarettes    Quit date: 10/01/1949  . Smokeless tobacco: Never Used  . Alcohol Use: No  . Drug Use: No  . Sexual Activity: Not on file   Other Topics Concern  . Not on file   Social History Narrative  . No narrative on file   Review of Systems No rash No diarrhea or vomiting Appetite is okay    Objective:   Physical Exam  Constitutional: He appears well-developed and well-nourished. No distress.  HENT:  Mouth/Throat: Oropharynx is clear and moist. No oropharyngeal exudate.  Very HOH No sinus tenderness Mild nasal inflammation TMs normal   Neck: Normal range of motion. Neck supple.  Pulmonary/Chest: Effort normal. No respiratory distress. He has no wheezes. He has no rales.  Slight rhonchi---may be more upper airway sounds  Lymphadenopathy:    He has no cervical adenopathy.  Assessment & Plan:

## 2013-09-14 ENCOUNTER — Telehealth: Payer: Self-pay | Admitting: Internal Medicine

## 2013-09-14 ENCOUNTER — Inpatient Hospital Stay: Payer: Self-pay | Admitting: Specialist

## 2013-09-14 LAB — CBC
HCT: 30 % — AB (ref 40.0–52.0)
HGB: 10.1 g/dL — ABNORMAL LOW (ref 12.0–16.0)
MCH: 30.4 pg (ref 26.0–34.0)
MCHC: 33.5 g/dL (ref 32.0–36.0)
MCV: 91 fL (ref 80–100)
PLATELETS: 191 10*3/uL (ref 150–440)
RBC: 3.31 10*6/uL — ABNORMAL LOW (ref 4.40–5.90)
RDW: 14.2 % (ref 11.5–14.5)
WBC: 9.3 10*3/uL (ref 3.8–10.6)

## 2013-09-14 LAB — BASIC METABOLIC PANEL
Anion Gap: 8 (ref 7–16)
BUN: 28 mg/dL — ABNORMAL HIGH (ref 7–18)
Calcium, Total: 8.7 mg/dL (ref 8.5–10.1)
Chloride: 106 mmol/L (ref 98–107)
Co2: 19 mmol/L — ABNORMAL LOW (ref 21–32)
Creatinine: 1.97 mg/dL — ABNORMAL HIGH (ref 0.60–1.30)
EGFR (Non-African Amer.): 27 — ABNORMAL LOW
GFR CALC AF AMER: 31 — AB
GLUCOSE: 108 mg/dL — AB (ref 65–99)
OSMOLALITY: 272 (ref 275–301)
Potassium: 4.9 mmol/L (ref 3.5–5.1)
SODIUM: 133 mmol/L — AB (ref 136–145)

## 2013-09-14 LAB — TROPONIN I: TROPONIN-I: 0.03 ng/mL

## 2013-09-14 LAB — RAPID INFLUENZA A&B ANTIGENS (ARMC ONLY)

## 2013-09-14 NOTE — Telephone Encounter (Signed)
Spoke with patient daughter [name], state her father saw physician Dr. Alphonsus SiasLetvak at [facility] for fever last week. He was told to use Acetaminophen and he gave him a prescription to get if any changes. His mucus is green and thick, up to today he was coughing up stuff but today he can not cough anything up. They can not seem to get him warm enough. They did get the prescription filled and began giving it to him today, he does have a low grade fever of 100.4. Daughter would like to know what they should do, he did not have the flu that day in the office but if this could be the flu now?

## 2013-09-14 NOTE — Telephone Encounter (Signed)
Yes, he may have flu. If he is short of breath or having worsening cough then he should be seen asap.

## 2013-09-14 NOTE — Telephone Encounter (Signed)
The patient's daughter wants to talk with you concerning her father . She stated he was seen in this office by another physician and since then he has gotten worse.

## 2013-09-14 NOTE — Telephone Encounter (Signed)
Informed patient daughter to take him to the ED, she verbally agreed.

## 2013-09-16 LAB — CREATININE, SERUM
Creatinine: 2.08 mg/dL — ABNORMAL HIGH (ref 0.60–1.30)
EGFR (African American): 29 — ABNORMAL LOW
EGFR (Non-African Amer.): 25 — ABNORMAL LOW

## 2013-09-18 LAB — EXPECTORATED SPUTUM ASSESSMENT W GRAM STAIN, RFLX TO RESP C

## 2013-09-19 LAB — CULTURE, BLOOD (SINGLE)

## 2013-09-21 ENCOUNTER — Telehealth: Payer: Self-pay | Admitting: Internal Medicine

## 2013-09-21 NOTE — Telephone Encounter (Signed)
Pt discharged from [facility] on Wednesday 09/16/13.  Was given antibiotics to take x7 days, which he has almost completed.  He is doing great. Needs hospital f/u.  No 30 min appt available until February.  Please advise.

## 2013-09-22 NOTE — Telephone Encounter (Signed)
2:15 to 2:45pm on Thursday?

## 2013-09-22 NOTE — Telephone Encounter (Signed)
Where can we put him

## 2013-09-22 NOTE — Telephone Encounter (Signed)
Spoke with patient daughter, appointment confirmed for Thursday at 215

## 2013-09-24 ENCOUNTER — Ambulatory Visit (INDEPENDENT_AMBULATORY_CARE_PROVIDER_SITE_OTHER): Payer: Medicare Other | Admitting: Internal Medicine

## 2013-09-24 ENCOUNTER — Encounter: Payer: Self-pay | Admitting: Internal Medicine

## 2013-09-24 VITALS — BP 158/70 | HR 73 | Temp 97.6°F | Wt 147.0 lb

## 2013-09-24 DIAGNOSIS — J189 Pneumonia, unspecified organism: Secondary | ICD-10-CM

## 2013-09-24 DIAGNOSIS — I10 Essential (primary) hypertension: Secondary | ICD-10-CM

## 2013-09-24 NOTE — Progress Notes (Signed)
   Subjective:    Patient ID: [name], male    DOB: 03/26/1911, 80 y.o.   MRN: [id number]  HPI 80YO male presents for follow up after recent hospitalization for pneumonia. He is feeling well. Occasional dry cough, but no dyspnea, chest pain, fever or chills. He completed course of doxycycline yesterday. No new concerns today.  Outpatient Encounter Prescriptions as of 09/24/2013  Medication Sig  . cholecalciferol (VITAMIN D) 1000 UNITS tablet Take 1,000 Units by mouth daily.  . ferrous fumarate (HEMOCYTE - 106 MG FE) 325 (106 FE) MG TABS tablet Take 1 tablet by mouth daily.  . furosemide (LASIX) 20 MG tablet Take 20 mg by mouth daily as needed.  . hydrALAZINE (APRESOLINE) 25 MG tablet TAKE 1 TABLET FOUR TIMES A DAY  . Multiple Vitamin (MULTIVITAMIN) tablet Take 1 tablet by mouth daily.  . Multiple Vitamins-Minerals (ZINC PO) Take by mouth daily.  Marland Kitchen. VITAMIN A PO Take by mouth daily.  . vitamin E 400 UNIT capsule Take 400 Units by mouth daily.  . [DISCONTINUED] doxycycline (ADOXA) 100 MG tablet Take 1 tablet (100 mg total) by mouth 2 (two) times daily.   BP 158/70  Pulse 73  Temp(Src) 97.6 F (36.4 C) (Oral)  Wt 147 lb (66.679 kg)  SpO2 98%  Review of Systems  Constitutional: Negative for fever, chills, activity change, appetite change, fatigue and unexpected weight change.  Eyes: Negative for visual disturbance.  Respiratory: Positive for cough. Negative for shortness of breath.   Cardiovascular: Negative for chest pain, palpitations and leg swelling.  Gastrointestinal: Negative for abdominal pain and abdominal distention.  Genitourinary: Negative for dysuria, urgency and difficulty urinating.  Musculoskeletal: Negative for arthralgias and gait problem.  Skin: Negative for color change and rash.  Hematological: Negative for adenopathy.  Psychiatric/Behavioral: Negative for sleep disturbance and dysphoric mood. The patient is not nervous/anxious.        Objective:   Physical Exam  Constitutional: He is oriented to person, place, and time. He appears well-developed and well-nourished. No distress.  HENT:  Head: Normocephalic and atraumatic.  Right Ear: External ear normal.  Left Ear: External ear normal.  Nose: Nose normal.  Mouth/Throat: Oropharynx is clear and moist. No oropharyngeal exudate.  Eyes: Conjunctivae and EOM are normal. Pupils are equal, round, and reactive to light. Right eye exhibits no discharge. Left eye exhibits no discharge. No scleral icterus.  Neck: Normal range of motion. Neck supple. No tracheal deviation present. No thyromegaly present.  Cardiovascular: Normal rate, regular rhythm and normal heart sounds.  Exam reveals no gallop and no friction rub.   No murmur heard. Pulmonary/Chest: Effort normal and breath sounds normal. No accessory muscle usage. Not tachypneic. No respiratory distress. He has no decreased breath sounds. He has no wheezes. He has no rhonchi. He has no rales. He exhibits no tenderness.  Musculoskeletal: Normal range of motion. He exhibits no edema.  Lymphadenopathy:    He has no cervical adenopathy.  Neurological: He is alert and oriented to person, place, and time. No cranial nerve deficit. Coordination normal.  Skin: Skin is warm and dry. No rash noted. He is not diaphoretic. No erythema. No pallor.  Psychiatric: He has a normal mood and affect. His behavior is normal. Judgment and thought content normal.          Assessment & Plan:

## 2013-09-24 NOTE — Assessment & Plan Note (Signed)
Recently hospitalized with CAP, treated with doxycycline. Symptomatically, doing well. Exam normal. Will get follow up CXR next week. RTC if any recurrent fever, chills, dyspnea.

## 2013-09-24 NOTE — Assessment & Plan Note (Signed)
BP Readings from Last 3 Encounters:  09/24/13 158/70  09/07/13 130/60  07/30/13 156/70   BP has generally been well controlled on current medications. Will continue. Follow up 3 months and prn.

## 2013-09-24 NOTE — Progress Notes (Signed)
Pre-visit discussion using our clinic review tool. No additional management support is needed unless otherwise documented below in the visit note.  

## 2013-09-24 NOTE — Patient Instructions (Signed)
Repeat chest xray next week.  Follow up in 3 months or sooner as needed.

## 2013-10-01 ENCOUNTER — Ambulatory Visit (INDEPENDENT_AMBULATORY_CARE_PROVIDER_SITE_OTHER)
Admission: RE | Admit: 2013-10-01 | Discharge: 2013-10-01 | Disposition: A | Discharge status: Home / Self Care | Payer: Medicare Other | Source: Ambulatory Visit | Attending: Internal Medicine | Admitting: Internal Medicine

## 2013-10-01 DIAGNOSIS — J189 Pneumonia, unspecified organism: Secondary | ICD-10-CM

## 2013-10-09 ENCOUNTER — Telehealth: Payer: Self-pay | Admitting: *Deleted

## 2013-10-09 MED ORDER — DOXYCYCLINE MONOHYDRATE 100 MG PO TABS: 100.0000 mg | ORAL_TABLET | Freq: Two times a day (BID) | ORAL | Status: DC

## 2013-10-09 NOTE — Telephone Encounter (Signed)
Message copied by Theola SequinLIVER, Zebulan Hinshaw L on Fri Oct 09, 2013  4:14 PM ------      Message from: Ronna PolioWALKER, JENNIFER A      Created: Thu Oct 01, 2013 12:33 PM       CXR showed possible persistent pneumonia in the right lung. I would like to treat with another course of Doxycycline 100mg  po bid x 10 days, then repeat a CXR in 4 weeks. ------

## 2013-10-09 NOTE — Telephone Encounter (Signed)
Prescription sent to pharmacy.

## 2013-10-14 ENCOUNTER — Telehealth: Payer: Self-pay | Admitting: Internal Medicine

## 2013-10-14 NOTE — Telephone Encounter (Signed)
Relevant patient education mailed to patient.  

## 2013-11-10 ENCOUNTER — Other Ambulatory Visit: Payer: Self-pay | Admitting: Internal Medicine

## 2013-11-10 ENCOUNTER — Ambulatory Visit (INDEPENDENT_AMBULATORY_CARE_PROVIDER_SITE_OTHER)
Admission: RE | Admit: 2013-11-10 | Discharge: 2013-11-10 | Disposition: A | Discharge status: Home / Self Care | Payer: Medicare Other | Source: Ambulatory Visit | Attending: Internal Medicine | Admitting: Internal Medicine

## 2013-11-10 DIAGNOSIS — J189 Pneumonia, unspecified organism: Secondary | ICD-10-CM

## 2013-12-14 ENCOUNTER — Encounter: Payer: Self-pay | Admitting: Family Medicine

## 2013-12-14 ENCOUNTER — Ambulatory Visit (INDEPENDENT_AMBULATORY_CARE_PROVIDER_SITE_OTHER): Payer: Medicare Other | Admitting: Family Medicine

## 2013-12-14 VITALS — BP 124/50 | HR 98 | Temp 97.6°F | Ht 63.0 in | Wt 152.8 lb

## 2013-12-14 DIAGNOSIS — M545 Low back pain, unspecified: Secondary | ICD-10-CM

## 2013-12-14 DIAGNOSIS — L8990 Pressure ulcer of unspecified site, unspecified stage: Secondary | ICD-10-CM

## 2013-12-14 DIAGNOSIS — L8991 Pressure ulcer of unspecified site, stage 1: Secondary | ICD-10-CM

## 2013-12-14 MED ORDER — TRAMADOL HCL 50 MG PO TABS: 25.0000 mg | ORAL_TABLET | Freq: Four times a day (QID) | ORAL | Status: DC | PRN

## 2013-12-14 MED ORDER — FLUOCINONIDE 0.05 % EX OINT: 1.0000 "application " | TOPICAL_OINTMENT | Freq: Two times a day (BID) | CUTANEOUS | Status: DC

## 2013-12-14 NOTE — Progress Notes (Signed)
Pre visit review using our clinic review tool, if applicable. No additional management support is needed unless otherwise documented below in the visit note. 

## 2013-12-14 NOTE — Progress Notes (Signed)
Date:  12/14/2013   Name:  Pedro PlumberJohnie Allen Sabo   DOB:  11/08/1910   MRN:  [id number]  Primary Physician:  Wynona DoveWALKER,JENNIFER AZBELL, MD   Chief Complaint: Back Pain   Subjective:   History of Present Illness:  [name] is a [age] y.o. pleasant patient who presents with the following:  Patient of Dr. Dan HumphreysWalker here for eval today, aged [age], who presents primarily for back pain. The patient is not the best historian, and his daughter does revise much of the history. He also is significantly hard of hearing.  Low back pain for about a week, and now it has developed - taking some advil. He is not sure if he did anything specifically to her his back, but he does just generally have some pain in the lower aspect of his lumbar spine. He denies any numbness or tingling. He denies any significant history prior back problems or pain. He has never had any prior back operations. He did do a little bit of heat at home, and that made it feel somewhat better.  b hip decub, stag 1: he doesn't 2 areas on the lateral aspect of either leg adjacent to the greater trochanter and a reddish appearance or purplish in appearance with some scale on them. He also has some in the area inferior to his coccyx near his anus. [place] Dr. Orson AloeHenderson about this, and he gave him some triamcinolone cream for this. They wanted my opinion about this.   Patient Active Problem List   Diagnosis Date Noted  . CAP (community acquired pneumonia) 09/24/2013  . Anemia 07/30/2013  . Skin lesion of face 07/30/2013  . Gait instability 06/25/2013  . Medicare annual wellness visit, subsequent 01/30/2013  . Carotid stenosis 12/18/2012  . Right carotid bruit 06/27/2012  . Penile swelling 05/28/2012  . Edema 03/12/2011  . HTN (hypertension) 03/12/2011  . CRI (chronic renal insufficiency) 03/12/2011    Past Medical History  Diagnosis Date  . Chronic kidney disease   . Syncope and collapse June 2012    pre syncopal    Past  Surgical History  Procedure Laterality Date  . Prostate surgery    . Intraocular lens implant, secondary  1990    bilateral implant  . Gallbladder surgery    . Hernia repair    . Kidney stone surgery      History   Social History  . Marital Status: Married    Spouse Name: N/A    Number of Children: N/A  . Years of Education: N/A   Occupational History  . Not on file.   Social History Main Topics  . Smoking status: Former Smoker -- 1.00 packs/day for 20 years    Types: Cigarettes    Quit date: 10/01/1949  . Smokeless tobacco: Never Used  . Alcohol Use: No  . Drug Use: No  . Sexual Activity: Not on file   Other Topics Concern  . Not on file   Social History Narrative  . No narrative on file    No family history on file.  Allergies  Allergen Reactions  . Amoxicillin   . Clarithromycin   . Influenza Vaccines   . Levofloxacin   . Macrodantin   . Metronidazole   . Micardis [Telmisartan]   . Nabumetone   . Neosporin [Neomycin-Polymyxin-Gramicidin]   . Penicillins   . Sulfa Drugs Cross Reactors   . Zithromax [Azithromycin] Diarrhea and Other (See Comments)    Dizzy, headache    Medication list  has been reviewed and updated.  Review of Systems:  GEN: No fevers, chills. Nontoxic. Primarily MSK c/o today. MSK: Detailed in the HPI GI: tolerating PO intake without difficulty Neuro: No numbness, parasthesias, or tingling associated. Otherwise the pertinent positives of the ROS are noted above.   Objective:   Physical Examination: BP 124/50  Pulse 98  Temp(Src) 97.6 F (36.4 C) (Oral)  Ht 5\' 3"  (1.6 m)  Wt 152 lb 12 oz (69.287 kg)  BMI 27.07 kg/m2  Ideal Body Weight: Weight in (lb) to have BMI = 25: 140.8   GEN: Well-developed,well-nourished,in no acute distress; alert,appropriate and cooperative throughout examination HEENT: Normocephalic and atraumatic without obvious abnormalities. Ears, externally no deformities PULM: Breathing comfortably in no  respiratory distress EXT: No clubbing, cyanosis, or edema PSYCH: Normally interactive. Cooperative during the interview. Pleasant. Friendly and conversant. Not anxious or depressed appearing. Normal, full affect.  Range of motion at  the waist: Flexion: normal Extension: normal Lateral bending: normal Rotation: all normal  No echymosis or edema Rises to examination table with no difficulty Gait: non antalgic  Inspection/Deformity: N Paraspinus Tenderness: mild l3-s1 b  B Ankle Dorsiflexion (L5,4): 5/5 B Great Toe Dorsiflexion (L5,4): 5/5 Heel Walk (L5): WNL Toe Walk (S1): WNL Rise/Squat (L4): WNL  SENSORY B Medial Foot (L4): WNL B Dorsum (L5): WNL B Lateral (S1): WNL Light Touch: WNL Pinprick: WNL  REFLEXES Knee (L4): 2+ Ankle (S1): 2+  B SLR, seated: neg B SLR, supine: neg B FABER: neg B Reverse FABER: neg B Greater Troch: NT B Log Roll: neg B Stork: NT B Sciatic Notch: TTP  SKIN: There are some changes just posterior to the greater trochanter where there is some purplish discoloration and a little bit of some scale. There also is some on the posterior area just inferior to the coccyx. The lateral areas are approximately 3-1/2 cm across each. There is no ulceration.  No results found.  Assessment & Plan:   Low back pain  Decubitus ulcer, stage 78  And a 80 year old patient, I would tend to be quite conservative into management of back pain. Most likely this will improve on its own. I suggested a heating pad, warm water bottle, and a warm bath. He can also try taking some tramadol, 1st starting with a half a tablet only. His daughter will monitor this medication for him.  I think that the areas on the lateral upper thigh are most consistent with early stage I decubitus ulcer with some small degree of scale. This makes sense from an anatomical standpoint since they're adjacent to bone and pressure points. I pointed this out to the patient and his daughter. They  could try some Lidex ointment for the scale.  Follow-up: No Follow-up on file.  New Prescriptions   FLUOCINONIDE OINTMENT (LIDEX) 0.05 %    Apply 1 application topically 2 (two) times daily.   TRAMADOL (ULTRAM) 50 MG TABLET    Take 0.5-1 tablets (25-50 mg total) by mouth every 6 (six) hours as needed.    Signed,  Elpidio Galea. Chaquana Nichols, MD, CAQ Sports Medicine  [facility] [address] [place] [address] Phone: [phone] Fax: [phone]  Patient's Medications  New Prescriptions   FLUOCINONIDE OINTMENT (LIDEX) 0.05 %    Apply 1 application topically 2 (two) times daily.   TRAMADOL (ULTRAM) 50 MG TABLET    Take 0.5-1 tablets (25-50 mg total) by mouth every 6 (six) hours as needed.  Previous Medications  CHOLECALCIFEROL (VITAMIN D) 1000 UNITS TABLET    Take 1,000 Units by mouth daily.   FERROUS FUMARATE (HEMOCYTE - 106 MG FE) 325 (106 FE) MG TABS TABLET    Take 1 tablet by mouth daily.   FUROSEMIDE (LASIX) 20 MG TABLET    Take 20 mg by mouth daily as needed.   HYDRALAZINE (APRESOLINE) 25 MG TABLET    TAKE 1 TABLET FOUR TIMES A DAY   MULTIPLE VITAMIN (MULTIVITAMIN) TABLET    Take 1 tablet by mouth daily.   MULTIPLE VITAMINS-MINERALS (ZINC PO)    Take by mouth daily.   VITAMIN A PO    Take by mouth daily.   VITAMIN E 400 UNIT CAPSULE    Take 400 Units by mouth daily.  Modified Medications   No medications on file  Discontinued Medications   DOXYCYCLINE (ADOXA) 100 MG TABLET    Take 1 tablet (100 mg total) by mouth 2 (two) times daily.

## 2013-12-22 ENCOUNTER — Ambulatory Visit (INDEPENDENT_AMBULATORY_CARE_PROVIDER_SITE_OTHER): Payer: Medicare Other | Admitting: Cardiovascular Disease

## 2013-12-22 ENCOUNTER — Encounter: Payer: Self-pay | Admitting: Cardiovascular Disease

## 2013-12-22 VITALS — BP 142/62 | HR 58 | Ht 60.0 in | Wt 150.5 lb

## 2013-12-22 DIAGNOSIS — I6529 Occlusion and stenosis of unspecified carotid artery: Secondary | ICD-10-CM

## 2013-12-22 DIAGNOSIS — R2681 Unsteadiness on feet: Secondary | ICD-10-CM

## 2013-12-22 DIAGNOSIS — R609 Edema, unspecified: Secondary | ICD-10-CM

## 2013-12-22 DIAGNOSIS — R269 Unspecified abnormalities of gait and mobility: Secondary | ICD-10-CM

## 2013-12-22 DIAGNOSIS — I10 Essential (primary) hypertension: Secondary | ICD-10-CM

## 2013-12-22 DIAGNOSIS — N189 Chronic kidney disease, unspecified: Secondary | ICD-10-CM

## 2013-12-22 NOTE — Progress Notes (Signed)
Patient ID: Pedro PlumberJohnie Allen [name], male    DOB: 10/31/1910, 80 y.o.   MRN: [id number]  HPI Comments: [name] is a 80 year old gentleman with a history of chronic hypertension, renal dysfunction, who presented to the hospital on March 06 2012 with bradycardia, severe hypertension. Cardiology was consulted to manage his blood pressure. He was discharged on hydralazine t.i.d. Creatinine is 2.0 Moderate bilateral carotid disease in late 2013  In general he reports that he has been doing well. Daughter presents with him today. He has Periodic incontinence, insomnia. Reports blood pressure is well controlled. He does not like to take pills. previousprostate issues and has polyuria at night time with urine retention.  Prior issues with swelling of his penis Chronic leg swelling, worse on the left leg. Takes Lasix when necessary. Does not like to use a cane or walker. He is requiring significant help from his family during the daytime. Wife died several days ago. He is  working in his garden.  He does report having swelling in his left leg for quite some time. No swelling in the right leg. This was there prior to his recent accident with the saw. Ultrasound in the hospital 05/18/2012 showed no DVT.  Previous Echo showed normal systolic function, EF >55%, mild to moderately elevated right ventricular systolic pressures, diastolic dysfunction  EKG shows normal sinus rhythm with rate 58 beats per minute, left axis deviation, nonspecific ST abnormality in leads V4 through V6, 3, aVF No change from one year ago    Outpatient Encounter Prescriptions as of 12/22/2013  Medication Sig  . cholecalciferol (VITAMIN D) 1000 UNITS tablet Take 1,000 Units by mouth daily.  . ferrous fumarate (HEMOCYTE - 106 MG FE) 325 (106 FE) MG TABS tablet Take 1 tablet by mouth daily.  . fluocinonide ointment (LIDEX) 0.05 % Apply 1 application topically 2 (two) times daily.  . furosemide (LASIX) 20 MG tablet Take 20 mg by mouth  daily as needed.  . hydrALAZINE (APRESOLINE) 25 MG tablet TAKE 1 TABLET FOUR TIMES A DAY  . Multiple Vitamin (MULTIVITAMIN) tablet Take 1 tablet by mouth daily.  . Multiple Vitamins-Minerals (ZINC PO) Take by mouth daily.  . traMADol (ULTRAM) 50 MG tablet Take 0.5-1 tablets (25-50 mg total) by mouth every 6 (six) hours as needed.  Marland Kitchen. VITAMIN A PO Take by mouth daily.  . vitamin E 400 UNIT capsule Take 400 Units by mouth daily.     Review of Systems  Constitutional: Negative.   HENT: Negative.   Eyes: Negative.   Respiratory: Negative.   Cardiovascular: Positive for leg swelling.  Gastrointestinal: Negative.   Endocrine: Negative.   Genitourinary: Positive for difficulty urinating.  Musculoskeletal: Positive for gait problem.  Skin: Negative.   Allergic/Immunologic: Negative.   Neurological: Negative.   Hematological: Negative.   Psychiatric/Behavioral: Negative.   All other systems reviewed and are negative.  BP 142/62  Pulse 58  Ht 5' (1.524 m)  Wt 150 lb 8 oz (68.266 kg)  BMI 29.39 kg/m2  Physical Exam  Nursing note and vitals reviewed. Constitutional: He is oriented to person, place, and time. He appears well-developed and well-nourished.  Elderly gentleman   HENT:  Head: Normocephalic.  Nose: Nose normal.  Mouth/Throat: Oropharynx is clear and moist.  Eyes: Conjunctivae are normal. Pupils are equal, round, and reactive to light.  Neck: Normal range of motion. Neck supple. No JVD present.  Cardiovascular: Normal rate, regular rhythm, S1 normal, S2 normal and intact distal pulses.  Exam reveals no  gallop and no friction rub.   Murmur heard.  Crescendo systolic murmur is present with a grade of 2/6  Pulmonary/Chest: Effort normal and breath sounds normal. No respiratory distress. He has no wheezes. He has no rales. He exhibits no tenderness.  Abdominal: Soft. Bowel sounds are normal. He exhibits no distension. There is no tenderness.  Musculoskeletal: Normal range of  motion. He exhibits no edema and no tenderness.  Lymphadenopathy:    He has no cervical adenopathy.  Neurological: He is alert and oriented to person, place, and time. Coordination normal.  Skin: Skin is warm and dry. No rash noted. No erythema.  Psychiatric: He has a normal mood and affect. His behavior is normal. Judgment and thought content normal.      Assessment and Plan

## 2013-12-22 NOTE — Assessment & Plan Note (Signed)
He is refusing a cane and walker. No recent falls

## 2013-12-22 NOTE — Assessment & Plan Note (Signed)
Trace to 1+ pitting edema around the ankles bilaterally, slightly worse on the left. This has been relatively stable. This was discussed with him. He we'll try to avoid diuretics given his renal dysfunction

## 2013-12-22 NOTE — Assessment & Plan Note (Signed)
Baseline creatinine 2.0.we'll try to avoid aggressive diuresis

## 2013-12-22 NOTE — Assessment & Plan Note (Signed)
Moderate bilateral carotid disease in 2013. Recommended repeat ultrasound at some point. He would like to discuss again on his next visit. Recommended he start aspirin 81 mg daily for stroke prevention

## 2013-12-22 NOTE — Patient Instructions (Signed)
You are doing well.  Please consider taking RED YEAST RICE for cholesterol 1 to 4 a day Start aspirin 81 mg coated one a day Call the office if you would like a carotid ultrasound  Please call us if you have new issues that need to be addressed before your next appt.  Your physician wants you to follow-up in: 6 months.  You will receive a reminder letter in the mail two months in advance. If you don't receive a letter, please call our office to schedule the follow-up appointment.

## 2013-12-22 NOTE — Assessment & Plan Note (Signed)
Blood pressure is well controlled on today's visit. No changes made to the medications. 

## 2013-12-28 ENCOUNTER — Ambulatory Visit: Payer: 59 | Admitting: Internal Medicine

## 2014-01-19 ENCOUNTER — Telehealth: Payer: Self-pay | Admitting: Internal Medicine

## 2014-01-19 NOTE — Telephone Encounter (Signed)
FYI, advised to take to ED for evaluation

## 2014-01-19 NOTE — Telephone Encounter (Signed)
Patient Information:  Caller Name: Chyrl CivatteJoann  Phone: [phone]  Patient: [name], [name]  Gender: Male  DOB: 07/28/1911  Age: 80 Years  PCP: Ronna PolioWalker, Jennifer (Adults only)  Office Follow Up:  Does the office need to follow up with this patient?: No  Instructions For The Office: N/A  RN Note:  No appts available today. Spoke to nurse Becky at the office she advised that pt could be seen by NP in am or if sxs are severe, should be evaluated in ED today. Advised daughter to take pt to ED to be seen today.  Symptoms  Reason For Call & Symptoms: Daughter calling about sleeping more, forgetful and sli more confused, wobbly when walking and started using [name] walker today, cough with thick yellow sputum, c/o elbows and knees hurting. BP 134/56, HR 66, O2 sat 98. Still stays by himself during the night but someone is with him during the day. Has an appt scheduled at the office on 01/21/14 but daugther is concerned and not sure if he should wait until then.  Reviewed Health History In EMR: Yes  Reviewed Medications In EMR: Yes  Reviewed Allergies In EMR: Yes  Reviewed Surgeries / Procedures: Yes  Date of Onset of Symptoms: 01/09/2014  Guideline(s) Used:  Confusion - Delirium  Disposition Per Guideline:   See Today in Office  Reason For Disposition Reached:   Patient wants to be seen (or caregiver requests)  Advice Given:  N/[name]  Patient Will Follow Care Advice:  YES

## 2014-01-21 ENCOUNTER — Ambulatory Visit (INDEPENDENT_AMBULATORY_CARE_PROVIDER_SITE_OTHER): Payer: Medicare Other | Admitting: Internal Medicine

## 2014-01-21 ENCOUNTER — Encounter: Payer: Self-pay | Admitting: Internal Medicine

## 2014-01-21 ENCOUNTER — Ambulatory Visit (INDEPENDENT_AMBULATORY_CARE_PROVIDER_SITE_OTHER): Payer: 59 | Admitting: Podiatrist

## 2014-01-21 VITALS — BP 160/74 | HR 61 | Temp 97.9°F | Wt 150.8 lb

## 2014-01-21 VITALS — BP 146/67 | HR 84 | Resp 16

## 2014-01-21 DIAGNOSIS — B351 Tinea unguium: Secondary | ICD-10-CM

## 2014-01-21 DIAGNOSIS — I10 Essential (primary) hypertension: Secondary | ICD-10-CM

## 2014-01-21 DIAGNOSIS — R5381 Other malaise: Secondary | ICD-10-CM

## 2014-01-21 DIAGNOSIS — R040 Epistaxis: Secondary | ICD-10-CM

## 2014-01-21 DIAGNOSIS — M79609 Pain in unspecified limb: Secondary | ICD-10-CM

## 2014-01-21 DIAGNOSIS — D649 Anemia, unspecified: Secondary | ICD-10-CM

## 2014-01-21 DIAGNOSIS — R5383 Other fatigue: Principal | ICD-10-CM

## 2014-01-21 DIAGNOSIS — N189 Chronic kidney disease, unspecified: Secondary | ICD-10-CM

## 2014-01-21 DIAGNOSIS — R269 Unspecified abnormalities of gait and mobility: Secondary | ICD-10-CM

## 2014-01-21 LAB — COMPREHENSIVE METABOLIC PANEL
ALBUMIN: 3.7 g/dL (ref 3.5–5.2)
ALK PHOS: 49 U/L (ref 39–117)
ALT: 15 U/L (ref 0–53)
AST: 23 U/L (ref 0–37)
BILIRUBIN TOTAL: 0.5 mg/dL (ref 0.2–1.2)
BUN: 38 mg/dL — ABNORMAL HIGH (ref 6–23)
CO2: 23 meq/L (ref 19–32)
Calcium: 9.2 mg/dL (ref 8.4–10.5)
Chloride: 112 mEq/L (ref 96–112)
Creatinine, Ser: 2.2 mg/dL — ABNORMAL HIGH (ref 0.4–1.5)
GFR: 29.98 mL/min — ABNORMAL LOW (ref >60.00)
GLUCOSE: 86 mg/dL (ref 70–99)
POTASSIUM: 4.6 meq/L (ref 3.5–5.1)
SODIUM: 140 meq/L (ref 135–145)
Total Protein: 6.1 g/dL (ref 6.0–8.3)

## 2014-01-21 LAB — CBC WITH DIFFERENTIAL/PLATELET
BASOS ABS: 0 10*3/uL (ref 0.0–0.1)
Basophils Relative: 0.4 % (ref 0.0–3.0)
EOS ABS: 0.1 10*3/uL (ref 0.0–0.7)
Eosinophils Relative: 1.9 % (ref 0.0–5.0)
HEMATOCRIT: 29.7 % — AB (ref 39.0–52.0)
Hemoglobin: 9.7 g/dL — ABNORMAL LOW (ref 13.0–17.0)
LYMPHS ABS: 1.1 10*3/uL (ref 0.7–4.0)
Lymphocytes Relative: 19.1 % (ref 12.0–46.0)
MCHC: 32.7 g/dL (ref 30.0–36.0)
MCV: 90.3 fl (ref 78.0–100.0)
MONO ABS: 0.4 10*3/uL (ref 0.1–1.0)
Monocytes Relative: 6.4 % (ref 3.0–12.0)
NEUTROS PCT: 72.2 % (ref 43.0–77.0)
Neutro Abs: 4.4 10*3/uL (ref 1.4–7.7)
PLATELETS: 208 10*3/uL (ref 150.0–400.0)
RBC: 3.29 Mil/uL — ABNORMAL LOW (ref 4.22–5.81)
RDW: 15.4 % (ref 11.5–15.5)
WBC: 6 10*3/uL (ref 4.0–10.5)

## 2014-01-21 LAB — TSH: TSH: 2.42 u[IU]/mL (ref 0.35–4.50)

## 2014-01-21 LAB — VITAMIN B12: VITAMIN B 12: 881 pg/mL (ref 211–911)

## 2014-01-21 LAB — FERRITIN: FERRITIN: 46.1 ng/mL (ref 22.0–322.0)

## 2014-01-21 NOTE — Progress Notes (Signed)
Subjective:    Patient ID: [name], male    DOB: 11/06/1910, 80 y.o.   MRN: [id number]  HPI [age]YO male presents for follow up.  Epistaxis - Two episodes last week on Saturday from left nare after hitting nose on handle of tiller. One large clot. Symptoms resolved by holding pressure to area.  Felt weak this week. Particularly on Tuesday. Daughter notes his gait was "stumbly." Noted some non-productive cough, decreased energy. No fever, chills. Normal appetite. Bowels moving normally. Symptoms have improved. Feeling more like his usual self. Attributes earlier symptoms to dehydration from being outside all day.  Review of Systems  Constitutional: Positive for fatigue. Negative for fever, chills, activity change, appetite change and unexpected weight change.  HENT: Positive for hearing loss and nosebleeds. Negative for congestion, ear discharge, ear pain, postnasal drip, rhinorrhea, sneezing, sore throat, trouble swallowing and voice change.   Eyes: Negative for visual disturbance.  Respiratory: Negative for cough and shortness of breath.   Cardiovascular: Negative for chest pain, palpitations and leg swelling.  Gastrointestinal: Negative for abdominal pain and abdominal distention.  Genitourinary: Negative for dysuria, urgency and difficulty urinating.  Musculoskeletal: Negative for arthralgias and gait problem.  Skin: Negative for color change and rash.  Neurological: Positive for weakness. Negative for dizziness, light-headedness, numbness and headaches.  Hematological: Negative for adenopathy.  Psychiatric/Behavioral: Negative for sleep disturbance and dysphoric mood. The patient is not nervous/anxious.        Objective:    BP 160/74  Pulse 61  Temp(Src) 97.9 F (36.6 C) (Oral)  Wt 150 lb 12 oz (68.38 kg)  SpO2 98% Physical Exam  Constitutional: He is oriented to person, place, and time. He appears well-developed and well-nourished. No distress.  HENT:  Head:  Normocephalic and atraumatic.  Right Ear: Tympanic membrane, external ear and ear canal normal. Decreased hearing is noted.  Left Ear: Tympanic membrane, external ear and ear canal normal. Decreased hearing is noted.  Nose: Nose normal. No mucosal edema, rhinorrhea, nose lacerations or sinus tenderness. No epistaxis.  Mouth/Throat: Oropharynx is clear and moist. No oropharyngeal exudate.  Eyes: Conjunctivae and EOM are normal. Pupils are equal, round, and reactive to light. Right eye exhibits no discharge. Left eye exhibits no discharge. No scleral icterus.  Neck: Normal range of motion. Neck supple. No tracheal deviation present. No thyromegaly present.  Cardiovascular: Normal rate, regular rhythm and normal heart sounds.  Exam reveals no gallop and no friction rub.   No murmur heard. Pulmonary/Chest: Effort normal and breath sounds normal. No accessory muscle usage. Not tachypneic. No respiratory distress. He has no decreased breath sounds. He has no wheezes. He has no rhonchi. He has no rales. He exhibits no tenderness.  Musculoskeletal: Normal range of motion. He exhibits no edema.  Lymphadenopathy:    He has no cervical adenopathy.  Neurological: He is alert and oriented to person, place, and time. No cranial nerve deficit. Coordination normal.  Skin: Skin is warm and dry. No rash noted. He is not diaphoretic. No erythema. No pallor.  Psychiatric: He has a normal mood and affect. His behavior is normal. Judgment and thought content normal.          Assessment & Plan:   Problem List Items Addressed This Visit   Anemia     Will recheck CBC with labs today.    Epistaxis     Recent episode of epistaxis after hitting nose on tiller. Exam normal today. Discussed how to control nosebleeds with applying  pressure and use of ice pack. If recurrent symptoms, discussed referral to ENT.    Relevant Orders      CBC with Differential      Ferritin   HTN (hypertension)      BP Readings from  Last 3 Encounters:  01/21/14 160/74  01/21/14 146/67  12/22/13 142/62   BP generally has been well controlled, esp considering his age. Will continue current medications. Check renal function with labs today.    Other malaise and fatigue - Primary     Recent episode of increased fatigue, likely from overexertion. Symptoms have now improved. Will check CBC, CMP, TSH, B12 with labs today. Encouraged adequate hydration. Follow up 4 weeks and prn.    Relevant Orders      Comprehensive metabolic panel      B12      TSH       Return in about 4 weeks (around 02/18/2014) for Recheck.

## 2014-01-21 NOTE — Assessment & Plan Note (Signed)
BP Readings from Last 3 Encounters:  01/21/14 160/74  01/21/14 146/67  12/22/13 142/62   BP generally has been well controlled, esp considering his age. Will continue current medications. Check renal function with labs today.

## 2014-01-21 NOTE — Assessment & Plan Note (Signed)
Recent episode of epistaxis after hitting nose on tiller. Exam normal today. Discussed how to control nosebleeds with applying pressure and use of ice pack. If recurrent symptoms, discussed referral to ENT.

## 2014-01-21 NOTE — Progress Notes (Signed)
Pre visit review using our clinic review tool, if applicable. No additional management support is needed unless otherwise documented below in the visit note. 

## 2014-01-21 NOTE — Assessment & Plan Note (Signed)
Will recheck CBC with labs today. 

## 2014-01-21 NOTE — Progress Notes (Signed)
HPI:  Patient presents today for follow up of foot and nail care. Denies any new complaints today.  Objective:  Patients chart is reviewed.  Vascular status reveals pedal pulses noted at 2 out of 4 dp and pt bilateral .  Neurological sensation is Normal to Semmes Weinstein monofilament bilateral.  Patients nails are thickened, discolored, distrophic, friable and brittle with yellow-brown discoloration. Patient subjectively relates they are painful with shoes and with ambulation of bilateral feet.  Assessment:  Symptomatic onychomycosis  Plan:  Discussed treatment options and alternatives.  The symptomatic toenails were debrided through manual an mechanical means without complication.  Return appointment recommended at routine intervals of 3 months    Lenny Bouchillon, DPM   

## 2014-01-21 NOTE — Assessment & Plan Note (Signed)
Recent episode of increased fatigue, likely from overexertion. Symptoms have now improved. Will check CBC, CMP, TSH, B12 with labs today. Encouraged adequate hydration. Follow up 4 weeks and prn.

## 2014-01-29 ENCOUNTER — Encounter: Payer: Self-pay | Admitting: Cardiovascular Disease

## 2014-03-30 ENCOUNTER — Other Ambulatory Visit: Payer: Self-pay | Admitting: Internal Medicine

## 2014-04-13 ENCOUNTER — Ambulatory Visit (INDEPENDENT_AMBULATORY_CARE_PROVIDER_SITE_OTHER): Payer: Medicare Other | Admitting: Adult Health

## 2014-04-13 ENCOUNTER — Encounter: Payer: Self-pay | Admitting: Adult Health

## 2014-04-13 VITALS — BP 156/60 | HR 62 | Temp 98.0°F | Resp 14 | Ht 60.0 in | Wt 151.5 lb

## 2014-04-13 DIAGNOSIS — R05 Cough: Secondary | ICD-10-CM

## 2014-04-13 DIAGNOSIS — R059 Cough, unspecified: Secondary | ICD-10-CM

## 2014-04-13 MED ORDER — DOXYCYCLINE HYCLATE 100 MG PO TABS: 100.0000 mg | ORAL_TABLET | Freq: Two times a day (BID) | ORAL | Status: DC

## 2014-04-13 NOTE — Progress Notes (Signed)
Pre visit review using our clinic review tool, if applicable. No additional management support is needed unless otherwise documented below in the visit note. 

## 2014-04-13 NOTE — Progress Notes (Signed)
Patient ID: Pedro PlumberJohnie Allen [name], male   DOB: 12/12/1910, 80 y.o.   MRN: [id number]    Subjective:    Patient ID: Pedro PlumberJohnie Allen [name], male    DOB: 07/30/1911, [age] y.o.   MRN: [id number]  HPI  Pleasant [age] y/o male who presents to clinic with congestion, cough, weakness that started Saturday. No fever, chills. Episode of pneumonia in January and successfully treated with Doxycycline. Multiple drug allergies. Pt is taking an OTC cough medication.   Past Medical History  Diagnosis Date  . Chronic kidney disease   . Syncope and collapse June 2012    pre syncopal    Current Outpatient Prescriptions on File Prior to Visit  Medication Sig Dispense Refill  . cholecalciferol (VITAMIN D) 1000 UNITS tablet Take 1,000 Units by mouth daily.      . ferrous fumarate (HEMOCYTE - 106 MG FE) 325 (106 FE) MG TABS tablet Take 1 tablet by mouth daily.      . fluocinonide ointment (LIDEX) 0.05 % Apply 1 application topically 2 (two) times daily.  60 g  1  . furosemide (LASIX) 20 MG tablet Take 20 mg by mouth daily as needed.      . hydrALAZINE (APRESOLINE) 25 MG tablet TAKE 1 TABLET FOUR TIMES A DAY  360 tablet  2  . Multiple Vitamin (MULTIVITAMIN) tablet Take 1 tablet by mouth daily.      . Multiple Vitamins-Minerals (ZINC PO) Take by mouth daily.      . traMADol (ULTRAM) 50 MG tablet Take 0.5-1 tablets (25-50 mg total) by mouth every 6 (six) hours as needed.  40 tablet  2  . VITAMIN A PO Take by mouth daily.      . vitamin E 400 UNIT capsule Take 400 Units by mouth daily.       No current facility-administered medications on file prior to visit.    Review of Systems  Constitutional: Positive for fatigue. Negative for fever and chills.  HENT: Positive for congestion. Negative for postnasal drip.   Respiratory: Positive for cough. Negative for shortness of breath and wheezing.   Gastrointestinal: Negative.   All other systems reviewed and are negative.     Objective:  BP 156/60  Pulse 62  Temp(Src)  98 F (36.7 C) (Oral)  Resp 14  Ht 5' (1.524 m)  Wt 151 lb 8 oz (68.72 kg)  BMI 29.59 kg/m2  SpO2 97%   Physical Exam  Constitutional: He is oriented to person, place, and time. He appears well-developed and well-nourished. No distress.  HENT:  Head: Normocephalic and atraumatic.  Mouth/Throat: No oropharyngeal exudate.  Eyes: Conjunctivae and EOM are normal.  Neck: Normal range of motion. Neck supple.  Cardiovascular: Normal rate and regular rhythm.   Pulmonary/Chest: Effort normal and breath sounds normal. No respiratory distress. He has no wheezes. He has no rales.  rhonchi  Musculoskeletal: Normal range of motion.  Lymphadenopathy:    He has no cervical adenopathy.  Neurological: He is alert and oriented to person, place, and time.  Skin: Skin is warm and dry.  Psychiatric: He has a normal mood and affect. His behavior is normal. Judgment and thought content normal.      Assessment & Plan:   1. Cough Continue OTC cough medication Start Doxycycline 100 mg bid x 10 days RTC if no improvement within 3-4 days or sooner if necessary.

## 2014-04-13 NOTE — Patient Instructions (Signed)
  Start doxycycline 1 capsule twice a day for 10 days.  Continue your over the counter cough medication as needed.  Please call if you are not improved within 3-4 days or sooner if symptoms worsen.

## 2014-06-21 ENCOUNTER — Ambulatory Visit (INDEPENDENT_AMBULATORY_CARE_PROVIDER_SITE_OTHER): Payer: Medicare Other | Admitting: Cardiovascular Disease

## 2014-06-21 ENCOUNTER — Encounter: Payer: Self-pay | Admitting: Cardiovascular Disease

## 2014-06-21 VITALS — BP 132/70 | HR 58 | Ht 63.0 in | Wt 150.0 lb

## 2014-06-21 DIAGNOSIS — N183 Chronic kidney disease, stage 3 unspecified: Secondary | ICD-10-CM

## 2014-06-21 DIAGNOSIS — R2681 Unsteadiness on feet: Secondary | ICD-10-CM

## 2014-06-21 DIAGNOSIS — I272 Pulmonary hypertension, unspecified: Secondary | ICD-10-CM

## 2014-06-21 DIAGNOSIS — R011 Cardiac murmur, unspecified: Secondary | ICD-10-CM

## 2014-06-21 DIAGNOSIS — I159 Secondary hypertension, unspecified: Secondary | ICD-10-CM

## 2014-06-21 DIAGNOSIS — I6523 Occlusion and stenosis of bilateral carotid arteries: Secondary | ICD-10-CM

## 2014-06-21 DIAGNOSIS — R609 Edema, unspecified: Secondary | ICD-10-CM

## 2014-06-21 DIAGNOSIS — I270 Primary pulmonary hypertension: Secondary | ICD-10-CM

## 2014-06-21 NOTE — Assessment & Plan Note (Signed)
Blood pressure is well controlled on today's visit. No changes made to the medications. 

## 2014-06-21 NOTE — Assessment & Plan Note (Signed)
Possible aortic valve sclerosis seen on prior echocardiogram 2012. Also has tricuspid regurgitation.

## 2014-06-21 NOTE — Progress Notes (Signed)
Patient ID: [name], male    DOB: 05/12/1911, [age] y.o.   MRN: [id number]  HPI Comments: [name] is a 80 year old gentleman with a history of chronic hypertension, renal dysfunction, who presented to the hospital on March 06 2012 with bradycardia, severe hypertension. Cardiology was consulted to manage his blood pressure. He was discharged on hydralazine t.i.d. Creatinine is 2.2 over the past several years, stable Moderate bilateral carotid disease in late 2013 He does not want a statin   he reports that he has been doing well.  He has Periodic incontinence, insomnia. Reports blood pressure is well controlled. He does not like to take pills. Previous prostate issues and has polyuria at night time with urine retention.  this continues to be a issue  Prior issues with swelling of his penis Chronic leg swelling, worse on the left leg. Takes Lasix when necessary. in general this has been stable  Does not like to use a cane or walker. He is requiring significant help from his family during the daytime. Wife died several days ago.    prior accident with a saw. Ultrasound in the hospital 05/18/2012 showed no DVT.  Previous Echo showed normal systolic function, EF >55%, mild to moderately elevated right ventricular systolic pressures, diastolic dysfunction  EKG shows normal sinus rhythm with rate 58 beats per minute, left axis deviation, no significant ST or T wave changes. Unchanged from October 2014    Outpatient Encounter Prescriptions as of 06/21/2014  Medication Sig  . cholecalciferol (VITAMIN D) 1000 UNITS tablet Take 1,000 Units by mouth daily.  . ferrous fumarate (HEMOCYTE - 106 MG FE) 325 (106 FE) MG TABS tablet Take 1 tablet by mouth daily.  . fluocinonide ointment (LIDEX) 0.05 % Apply 1 application topically 2 (two) times daily.  . furosemide (LASIX) 20 MG tablet Take 20 mg by mouth daily as needed.  . hydrALAZINE (APRESOLINE) 25 MG tablet TAKE 1 TABLET FOUR TIMES A DAY  .  Multiple Vitamin (MULTIVITAMIN) tablet Take 1 tablet by mouth daily.  . Multiple Vitamins-Minerals (ZINC PO) Take by mouth daily.  . traMADol (ULTRAM) 50 MG tablet Take 0.5-1 tablets (25-50 mg total) by mouth every 6 (six) hours as needed.  Marland Kitchen. VITAMIN A PO Take by mouth daily.  . vitamin E 400 UNIT capsule Take 400 Units by mouth daily.    Review of Systems  Constitutional: Negative.   HENT: Negative.   Eyes: Negative.   Respiratory: Negative.   Cardiovascular: Positive for leg swelling.  Gastrointestinal: Negative.   Endocrine: Negative.   Genitourinary: Positive for difficulty urinating.  Musculoskeletal: Positive for gait problem.  Skin: Negative.   Allergic/Immunologic: Negative.   Neurological: Negative.   Hematological: Negative.   Psychiatric/Behavioral: Negative.   All other systems reviewed and are negative.  BP 132/70  Pulse 58  Ht 5\' 3"  (1.6 m)  Wt 150 lb (68.04 kg)  BMI 26.58 kg/m2  Physical Exam  Nursing note and vitals reviewed. Constitutional: He is oriented to person, place, and time. He appears well-developed and well-nourished.  Elderly gentleman   HENT:  Head: Normocephalic.  Nose: Nose normal.  Mouth/Throat: Oropharynx is clear and moist.  Eyes: Conjunctivae are normal. Pupils are equal, round, and reactive to light.  Neck: Normal range of motion. Neck supple. No JVD present. Carotid bruit is present.  Cardiovascular: Normal rate, regular rhythm, S1 normal, S2 normal and intact distal pulses.  Exam reveals no gallop and no friction rub.   Murmur heard.  Crescendo  systolic murmur is present with a grade of 2/6  Pulmonary/Chest: Effort normal and breath sounds normal. No respiratory distress. He has no wheezes. He has no rales. He exhibits no tenderness.  Abdominal: Soft. Bowel sounds are normal. He exhibits no distension. There is no tenderness.  Musculoskeletal: Normal range of motion. He exhibits no edema and no tenderness.  Lymphadenopathy:    He  has no cervical adenopathy.  Neurological: He is alert and oriented to person, place, and time. Coordination normal.  Skin: Skin is warm and dry. No rash noted. No erythema.  Psychiatric: He has a normal mood and affect. His behavior is normal. Judgment and thought content normal.      Assessment and Plan

## 2014-06-21 NOTE — Assessment & Plan Note (Signed)
creatinine stable 2.2. He uses Lasix sparingly

## 2014-06-21 NOTE — Assessment & Plan Note (Signed)
He does not want a cane or walker. Has care during the daytime

## 2014-06-21 NOTE — Assessment & Plan Note (Signed)
Mild to moderate pulmonary hypertension seen on echocardiogram in 2012. Recommended Lasix for worsening leg edema or shortness of breath

## 2014-06-21 NOTE — Assessment & Plan Note (Addendum)
Chronic trace lower extremity  edema, worse on the left than the right. Possibly from venous insufficiency/dependent edema. He will use Lasix when necessary

## 2014-06-21 NOTE — Assessment & Plan Note (Signed)
Moderate disease on prior scan in 2013. He is now on a statin

## 2014-06-21 NOTE — Patient Instructions (Addendum)
You are doing well. No medication changes were made.  Please call us if you have new issues that need to be addressed before your next appt.  Your physician wants you to follow-up in: 12 months.  You will receive a reminder letter in the mail two months in advance. If you don't receive a letter, please call our office to schedule the follow-up appointment.  Your next appointment will be scheduled in our new office located at :  [facility]  [address], [address]  [place], [place] [address]  

## 2014-08-23 ENCOUNTER — Ambulatory Visit (INDEPENDENT_AMBULATORY_CARE_PROVIDER_SITE_OTHER): Payer: Medicare Other | Admitting: Podiatry

## 2014-08-23 DIAGNOSIS — B351 Tinea unguium: Secondary | ICD-10-CM

## 2014-08-23 DIAGNOSIS — M79676 Pain in unspecified toe(s): Secondary | ICD-10-CM

## 2014-08-23 NOTE — Progress Notes (Signed)
S: Returns for followup nail /lesion care.  O: Pulses intact. Nails thickened/painful/dystrophic, tender with footwear.      Callus L medial 1st MPJ and pinch callus noted R hallux  A:  symptomatic onychomycosis as noted  P:  Symptomatic nails debrided and above skin lesions reduced. Will see in 3 months for followup.

## 2014-10-22 ENCOUNTER — Encounter: Payer: Self-pay | Admitting: Internal Medicine

## 2014-10-22 ENCOUNTER — Ambulatory Visit (INDEPENDENT_AMBULATORY_CARE_PROVIDER_SITE_OTHER): Payer: Medicare Other | Admitting: Internal Medicine

## 2014-10-22 VITALS — BP 120/62 | HR 66 | Temp 97.8°F | Resp 14 | Ht 63.0 in | Wt 151.6 lb

## 2014-10-22 DIAGNOSIS — Z79899 Other long term (current) drug therapy: Secondary | ICD-10-CM

## 2014-10-22 DIAGNOSIS — R531 Weakness: Secondary | ICD-10-CM | POA: Diagnosis not present

## 2014-10-22 DIAGNOSIS — I10 Essential (primary) hypertension: Secondary | ICD-10-CM

## 2014-10-22 LAB — POCT URINALYSIS DIPSTICK
BILIRUBIN UA: NEGATIVE
Blood, UA: NEGATIVE
Glucose, UA: NEGATIVE
KETONES UA: NEGATIVE
LEUKOCYTES UA: NEGATIVE
NITRITE UA: NEGATIVE
PH UA: 5.5
PROTEIN UA: NEGATIVE
Spec Grav, UA: 1.01
Urobilinogen, UA: 0.2

## 2014-10-22 LAB — COMPREHENSIVE METABOLIC PANEL
ALT: 11 U/L (ref 0–53)
AST: 18 U/L (ref 0–37)
Albumin: 3.3 g/dL — ABNORMAL LOW (ref 3.5–5.2)
Alkaline Phosphatase: 68 U/L (ref 39–117)
BILIRUBIN TOTAL: 0.4 mg/dL (ref 0.2–1.2)
BUN: 30 mg/dL — ABNORMAL HIGH (ref 6–23)
CALCIUM: 9 mg/dL (ref 8.4–10.5)
CO2: 26 meq/L (ref 19–32)
Chloride: 109 mEq/L (ref 96–112)
Creatinine, Ser: 2.19 mg/dL — ABNORMAL HIGH (ref 0.40–1.50)
GFR: 29.46 mL/min — AB (ref >60.00)
Glucose, Bld: 85 mg/dL (ref 70–99)
Potassium: 4.4 mEq/L (ref 3.5–5.1)
SODIUM: 139 meq/L (ref 135–145)
Total Protein: 6.1 g/dL (ref 6.0–8.3)

## 2014-10-22 LAB — CBC WITH DIFFERENTIAL/PLATELET
BASOS ABS: 0 10*3/uL (ref 0.0–0.1)
BASOS PCT: 0.2 % (ref 0.0–3.0)
Eosinophils Absolute: 0.1 10*3/uL (ref 0.0–0.7)
Eosinophils Relative: 3.8 % (ref 0.0–5.0)
HCT: 27.8 % — ABNORMAL LOW (ref 39.0–52.0)
Hemoglobin: 9.1 g/dL — ABNORMAL LOW (ref 13.0–17.0)
Lymphocytes Relative: 18.3 % (ref 12.0–46.0)
Lymphs Abs: 0.6 10*3/uL — ABNORMAL LOW (ref 0.7–4.0)
MCHC: 32.8 g/dL (ref 30.0–36.0)
MCV: 85.7 fl (ref 78.0–100.0)
MONO ABS: 0.4 10*3/uL (ref 0.1–1.0)
Monocytes Relative: 10.6 % (ref 3.0–12.0)
NEUTROS PCT: 67.1 % (ref 43.0–77.0)
Neutro Abs: 2.4 10*3/uL (ref 1.4–7.7)
Platelets: 207 10*3/uL (ref 150.0–400.0)
RBC: 3.24 Mil/uL — ABNORMAL LOW (ref 4.22–5.81)
RDW: 15.7 % — AB (ref 11.5–15.5)
WBC: 3.5 10*3/uL — AB (ref 4.0–10.5)

## 2014-10-22 LAB — TSH: TSH: 2.84 u[IU]/mL (ref 0.35–4.50)

## 2014-10-22 LAB — VITAMIN B12: VITAMIN B 12: 369 pg/mL (ref 211–911)

## 2014-10-22 LAB — HEMOGLOBIN A1C: HEMOGLOBIN A1C: 5.6 % (ref 4.6–6.5)

## 2014-10-22 LAB — MAGNESIUM: Magnesium: 2 mg/dL (ref 1.5–2.5)

## 2014-10-22 LAB — VITAMIN D 25 HYDROXY (VIT D DEFICIENCY, FRACTURES): VITD: 65.63 ng/mL (ref 30.00–100.00)

## 2014-10-22 NOTE — Assessment & Plan Note (Addendum)
Symptoms of intermittent weakness/fatigue. He is 81 years old. Denies any focal symptoms such as chest pain, dyspnea. Exam is normal. Will check basic labs including CBC, CMP, lipids, TSH, Mag. Pt daughter will call if any new or persistent symptoms.

## 2014-10-22 NOTE — Patient Instructions (Signed)
Labs today.  Discontinue Vit A and Vit E supplement

## 2014-10-22 NOTE — Progress Notes (Signed)
Pre visit review using our clinic review tool, if applicable. No additional management support is needed unless otherwise documented below in the visit note. 

## 2014-10-22 NOTE — Progress Notes (Signed)
Subjective:    Patient ID: [name], male    DOB: 10/17/1910, [age] y.o.   MRN: [id number]  HPI  [age]YO male presents for followup.  Having some spells where he feels very shaky and weak. Last episode on Monday. Lasted a few hours. At times last all day. Occurs intermittently over last several months.  No confusion or slurred speech during these events.  No focal weakness or numbness noted. No chest pain, dyspnea.  Appetite has been normal. However, daughters have been limiting intake of pork. No diarrhea, NV. No urinary symptoms. Denies pain. He has no other concerns.   Past medical, surgical, family and social history per today's encounter.  Review of Systems  Constitutional: Positive for fatigue. Negative for fever, chills, activity change, appetite change and unexpected weight change.  Eyes: Negative for visual disturbance.  Respiratory: Negative for cough and shortness of breath.   Cardiovascular: Negative for chest pain, palpitations and leg swelling.  Gastrointestinal: Negative for nausea, vomiting, abdominal pain, diarrhea, constipation and abdominal distention.  Genitourinary: Negative for dysuria, urgency and difficulty urinating.  Musculoskeletal: Negative for arthralgias and gait problem.  Skin: Negative for color change and rash.  Neurological: Positive for weakness.  Hematological: Negative for adenopathy.  Psychiatric/Behavioral: Negative for sleep disturbance and dysphoric mood. The patient is not nervous/anxious.        Objective:    BP 120/62 mmHg  Pulse 66  Temp(Src) 97.8 F (36.6 C) (Oral)  Resp 14  Ht 5\' 3"  (1.6 m)  Wt 151 lb 9.6 oz (68.765 kg)  BMI 26.86 kg/m2  SpO2 98% Physical Exam  Constitutional: He is oriented to person, place, and time. He appears well-developed and well-nourished. No distress.  HENT:  Head: Normocephalic and atraumatic.  Right Ear: External ear normal. Decreased hearing is noted.  Left Ear: External ear normal.  Decreased hearing is noted.  Nose: Nose normal.  Mouth/Throat: Oropharynx is clear and moist. No oropharyngeal exudate.  Eyes: Conjunctivae and EOM are normal. Pupils are equal, round, and reactive to light. Right eye exhibits no discharge. Left eye exhibits no discharge. No scleral icterus.  Neck: Normal range of motion. Neck supple. No tracheal deviation present. No thyromegaly present.  Cardiovascular: Normal rate and regular rhythm.  Exam reveals no gallop and no friction rub.   Murmur heard. Pulmonary/Chest: Effort normal and breath sounds normal. No accessory muscle usage. No tachypnea. No respiratory distress. He has no decreased breath sounds. He has no wheezes. He has no rhonchi. He has no rales. He exhibits no tenderness.  Abdominal: Soft. Bowel sounds are normal. He exhibits no distension and no mass. There is no tenderness. There is no rebound and no guarding.  Musculoskeletal: Normal range of motion. He exhibits no edema.  Lymphadenopathy:    He has no cervical adenopathy.  Neurological: He is alert and oriented to person, place, and time. No cranial nerve deficit. Coordination normal.  Skin: Skin is warm and dry. No rash noted. He is not diaphoretic. No erythema. No pallor.  Psychiatric: He has a normal mood and affect. His behavior is normal. Judgment and thought content normal.          Assessment & Plan:  Over 25min of which >50% spent in face-to-face contact with patient discussing plan of care  Problem List Items Addressed This Visit      Unprioritized   Weakness - Primary    Symptoms of intermittent weakness/fatigue. Denies any focal symptoms such as chest pain, dyspnea. Will check  basic labs including CBC, CMP, lipids, TSH, Mag. Pt daughter will call if any new or persistent symptoms.      Relevant Orders   Hemoglobin A1c   TSH   B12   CBC w/Diff   Comprehensive metabolic panel   Magnesium   Vit D  25 hydroxy (rtn osteoporosis monitoring)   POCT Urinalysis  Dipstick       Return in about 4 weeks (around 11/19/2014).

## 2014-11-23 ENCOUNTER — Ambulatory Visit (INDEPENDENT_AMBULATORY_CARE_PROVIDER_SITE_OTHER): Payer: Medicare Other | Admitting: Internal Medicine

## 2014-11-23 ENCOUNTER — Encounter: Payer: Self-pay | Admitting: Internal Medicine

## 2014-11-23 VITALS — BP 153/64 | HR 60 | Temp 98.6°F | Ht 63.0 in | Wt 151.0 lb

## 2014-11-23 DIAGNOSIS — D649 Anemia, unspecified: Secondary | ICD-10-CM

## 2014-11-23 DIAGNOSIS — N183 Chronic kidney disease, stage 3 unspecified: Secondary | ICD-10-CM

## 2014-11-23 DIAGNOSIS — R531 Weakness: Secondary | ICD-10-CM

## 2014-11-23 NOTE — Patient Instructions (Signed)
Complete stool IFOB.  Follow up in 3 months or sooner as needed.

## 2014-11-23 NOTE — Progress Notes (Signed)
Subjective:    Patient ID: [name], male    DOB: 10/07/1910, 81 y.o.   MRN: [id number]  HPI  81YO male presents for follow up.  Last seen 2/12 for weakness.  Continues to have some weakness in his legs. No recent episodes of syncope. Using cane at home on occasion.  Appetite has been good. Denies any pain.  Denies any concerns today. Found on labs to have mild anemia, relatively stable over last 4 years, and CKD which was stable. He denies any change in stools, bloody stools, black stools. He has been taking an iron supplement.  Past medical, surgical, family and social history per today's encounter.  Review of Systems  Constitutional: Negative for fever, chills, activity change, appetite change, fatigue and unexpected weight change.  Eyes: Negative for visual disturbance.  Respiratory: Negative for cough and shortness of breath.   Cardiovascular: Negative for chest pain, palpitations and leg swelling.  Gastrointestinal: Negative for nausea, vomiting, abdominal pain, diarrhea, constipation and abdominal distention.  Genitourinary: Negative for dysuria, urgency and difficulty urinating.  Musculoskeletal: Negative for myalgias, arthralgias and gait problem.  Skin: Negative for color change and rash.  Neurological: Positive for tremors (hands bilaterally when nervous) and weakness. Negative for dizziness, light-headedness, numbness and headaches.  Hematological: Negative for adenopathy.  Psychiatric/Behavioral: Negative for suicidal ideas, sleep disturbance and dysphoric mood. The patient is not nervous/anxious.        Objective:    BP 153/64 mmHg  Pulse 60  Temp(Src) 98.6 F (37 C) (Oral)  Ht  (1.6 m)  Wt 151 lb (68.493 kg)  BMI 26.76 kg/m2  SpO2 95% Physical Exam  Constitutional: He is oriented to person, place, and time. He appears well-developed and well-nourished. No distress.  HENT:  Head: Normocephalic and atraumatic.  Right Ear: External ear  normal.  Left Ear: External ear normal.  Nose: Nose normal.  Mouth/Throat: Oropharynx is clear and moist. No oropharyngeal exudate.  Eyes: Conjunctivae and EOM are normal. Pupils are equal, round, and reactive to light. Right eye exhibits no discharge. Left eye exhibits no discharge. No scleral icterus.  Neck: Normal range of motion. Neck supple. No tracheal deviation present. No thyromegaly present.  Cardiovascular: Normal rate, regular rhythm and normal heart sounds.  Exam reveals no gallop and no friction rub.   No murmur heard. Pulmonary/Chest: Effort normal and breath sounds normal. No accessory muscle usage. No tachypnea. No respiratory distress. He has no decreased breath sounds. He has no wheezes. He has no rhonchi. He has no rales. He exhibits no tenderness.  Musculoskeletal: Normal range of motion. He exhibits no edema.  Lymphadenopathy:    He has no cervical adenopathy.  Neurological: He is alert and oriented to person, place, and time. No cranial nerve deficit. Coordination normal.  Skin: Skin is warm and dry. No rash noted. He is not diaphoretic. No erythema. No pallor.  Psychiatric: He has a normal mood and affect. His behavior is normal. Judgment and thought content normal.          Assessment & Plan:   Problem List Items Addressed This Visit      Unprioritized   Anemia    Chronic mild anemia relatively stable over last 4 years. Likely AOCD and anemia related to CKD. Will check stool ifob. Continue ferrous fumarate. Follow up in 3 months, with repeat CBC at that time.      Relevant Orders   Fecal occult blood, imunochemical   CRI (chronic renal insufficiency)  Lab Results  Component Value Date   CREATININE 2.19* 10/22/2014   Reviewed labs with pt and his daughter. Kidney function stable over last 4 years. Will continue to monitor, avoid nephrotoxic medications, encourage hydration. Plan repeat labs in 3 months.      Weakness - Primary    Chronic mild weakness  in bilateral legs. Likely multifactorial. Encouraged him to use a cane for falls prevention. Reviewed labs with his daughters.          Return in about 3 months (around 02/23/2015) for Recheck.

## 2014-11-23 NOTE — Assessment & Plan Note (Signed)
Lab Results  Component Value Date   CREATININE 2.19* 10/22/2014   Reviewed labs with pt and his daughter. Kidney function stable over last 4 years. Will continue to monitor, avoid nephrotoxic medications, encourage hydration. Plan repeat labs in 3 months.

## 2014-11-23 NOTE — Assessment & Plan Note (Signed)
Chronic mild weakness in bilateral legs. Likely multifactorial. Encouraged him to use a cane for falls prevention. Reviewed labs with his daughters.

## 2014-11-23 NOTE — Progress Notes (Signed)
Pre visit review using our clinic review tool, if applicable. No additional management support is needed unless otherwise documented below in the visit note. 

## 2014-11-23 NOTE — Assessment & Plan Note (Addendum)
Chronic mild anemia relatively stable over last 4 years. Likely AOCD and anemia related to CKD. Will check stool ifob. Continue ferrous fumarate. Follow up in 3 months, with repeat CBC at that time.

## 2014-12-02 ENCOUNTER — Other Ambulatory Visit: Payer: Self-pay | Admitting: Internal Medicine

## 2014-12-28 NOTE — H&P (Signed)
PATIENT NAME:  [name], [name] MR#:  [id number] DATE OF BIRTH:  [birth year]  DATE OF ADMISSION:  05/18/2012  ADMITTING PHYSICIAN:  Dr. Enid Baasadhika Lautaro Koral  PRIMARY CARE PHYSICIAN: [name]   CHIEF COMPLAINT:  Left thigh redness worsening after cut wound.   HISTORY OF PRESENT ILLNESS: [name] is [name] very pleasant 81 year old Caucasian male with past medical history significant for hypertension, chronic kidney disease, and history of Bell's palsy who was brought in from home secondary to worsening of left thigh infection. The patient is very active at baseline and he was trying to use [name] Skil saw to cut [name] headboard from the bed, but accidentally cut his thigh. He has an 8-cm sutured laceration on the anterior part of the distal thigh on examination today. [facility] and had it sutured at the time and he was started on Keflex for 10 days. This was last Tuesday, September 3.  Over the next day or two he felt that his wound was getting better, but over the last three days he has had more redness noted around the suture site and also more swelling that is extending down his knee into his leg, so he comes to the Emergency Room. He denies any fevers or chills at this time.   PAST MEDICAL HISTORY:  1. Hypertension.  2. Chronic kidney disease stage IV.  3. History of Bell's palsy with residual slurred speech.  4. Moderate pulmonary hypertension.   PAST SURGICAL HISTORY:  1. Cataract surgery.  2. Cholecystectomy.  3. Renal kidney stone surgery.  4. Hernia repair.  5. Surgery for benign prostatic hypertrophy.   ALLERGIES TO MEDICATIONS: Penicillin, amoxicillin, sulfa,  Biaxin, Levaquin, Macrodantin, metronidazole, nabumetone, and Micardis.   HOME MEDICATIONS:  1. Hydralazine 25 mg p.o. 4 times daily.  2. P.r.n. over-the-counter allergy medications.  3. Keflex was started on 05/13/2012 for ten days.  SOCIAL HISTORY: Lives at home with his wife and has two daughters who  check on him every day.  No history of any smoking or alcohol use.   FAMILY HISTORY: Both parents died in their 5160s. Mother had kidney disease and died from uremia and father had aneurysm and also coronary artery disease.  REVIEW OF SYSTEMS: No fever, fatigue, or weakness. EYES: Has macular degeneration and cataracts. No blurred vision or glaucoma. ENT:  Positive for hearing loss and also positive for runny nose all the time due to allergies.  No tinnitus, ear pain, or epistaxis. RESPIRATORY: No cough, wheeze, hemoptysis, or chronic obstructive pulmonary disease. CARDIOVASCULAR: No chest pain, orthopnea, edema, arrhythmia, palpitations, or syncope. GI: No nausea, vomiting, diarrhea, abdominal pain, hematemesis, or melena. GU: Positive for hesitation. No dysuria or frequency or incontinence. Positive for history of kidney stones. ENDOCRINE: No polyuria,  nocturia, thyroid problems, or heat or cold intolerance. HEMATOLOGY: No anemia, easy bruising or bleeding. SKIN: No acne, rash, or lesions. MUSCULOSKELETAL: No neck, back, shoulder pain, arthritis, or gout. NEUROLOGIC: No numbness, weakness, cerebrovascular accident, transient ischemic attack, or seizures. PSYCH: No anxiety, insomnia, or depression.   PHYSICAL EXAMINATION:  VITAL SIGNS: Temperature 97.2 degrees Fahrenheit, pulse 68, respirations 18, blood pressure 153/67, pulse oximetry 96% on room air.  GENERAL: Well-built, well-nourished elderly man lying in bed, not in any acute distress.   HEENT: Normocephalic, atraumatic. Pupils equal, round, reacting to light. Anicteric sclerae. Extraocular movements intact. Oropharynx clear without erythema, mass, or exudates.   NECK: Supple. No thyromegaly, JVD, or carotid bruits. No lymphadenopathy.   LUNGS:  Moving air bilaterally. Breath sounds are clear. No wheeze or crackles. No use of accessory muscles for breathing.   CARDIOVASCULAR: S1, S2, regular rate and rhythm. No murmurs, rubs, or gallops.    ABDOMEN: Soft, nontender, nondistended. No hepatosplenomegaly. Normal bowel sounds.   EXTREMITIES: He does have 1+ edema of the left lower extremity and there is an 8-cm diagonal cut that is sutured across the anterior part of the distal thigh with surrounding erythema that is also spreading around the knee at this time.  He denies any tenderness.  His pulses are very feebly palpable.   SKIN: Other than the laceration described above no other lesions noted.   LYMPH: No cervical or inguinal lymphadenopathy.   NEUROLOGIC: Cranial nerves intact except speech secondary to being affected by Bell's palsy.   Strength is similar in all four extremities except for decreased strength in the right upper extremity secondary to chronic right shoulder rotator cuff problem.  PSYCH: The patient is awake, alert, oriented times three.   LABORATORY, DIAGNOSTIC, AND RADIOLOGICAL DATA: WBC 3.8, hemoglobin 9.6, hematocrit 29.1, platelet count 153. Sodium 144, potassium 4.8, chloride 113, bicarbonate 25, BUN 34, creatinine 2.56, glucose 99, calcium 8.8. ALT 14, AST 24, alkaline phosphatase 70, total bilirubin 0.3, albumin 3.3. Doppler ultrasound of the lower extremities showing no evidence of thrombus within the left femoral or popliteal veins.   ASSESSMENT AND PLAN: 81 year old male with past medical history significant for hypertension, chronic kidney disease stage IV who is very active at baseline and had an accidental cut on his thigh from [name] Skil saw.  He had it sutured and was on Keflex but comes back with worsening cellulitis. 1. Left thigh cellulitis after [name] cut wound, failed outpatient Keflex treatment. He received [name] tetanus shot last week. Blood cultures have been ordered. We will start on IV vancomycin and Invanz.  Surgical consult to take [name] look at the sutures and monitor.  2. Hypertension. Continue p.o. hydralazine q.i.d. dose and IV p.r.n.. 3. Chronic kidney disease stage IV, appears at baseline. Continue  to monitor on antibiotics, pharmacy to renally dose medications. 4. History of Bell's palsy, has slightly slurred speech and he is at baseline.  5. GI and deep vein thrombosis prophylaxis with Protonix and Lovenox. 6. CODE STATUS: FULL CODE.    TIME SPENT ON ADMISSION: 50 minutes.    ____________________________ Enid Baas, MD rk:bjt D: 05/18/2012 17:23:59 ET T: 05/19/2012 08:01:36 ET JOB#: [id number]  cc: Enid Baas, MD, <Dictator> Ginette Pitman. Dan Humphreys, MD Enid Baas MD ELECTRONICALLY SIGNED 05/20/2012 13:28

## 2014-12-28 NOTE — Consult Note (Signed)
PATIENT NAME:  [name], [name] MR#:  [id number] DATE OF BIRTH:  03-25-1911  DATE OF CONSULTATION:  05/19/2012  CONSULTING PHYSICIAN:  Cristal Deerhristopher [name]. Demitri Kucinski, MD  REASON FOR CONSULTATION: Evaluation of left leg wound.   HISTORY OF PRESENT ILLNESS: [name] is [name] relatively healthy 81 year old male who, according to him last Tuesday. or approximately six days ago, was trying to cut [name] bed board from the bed and cut his thigh. He went to an Urgent Care Clinic and they had sutured his laceration and given him Keflex. He said that over the last few days he has had more redness at the suture site, more swelling down his knee into his leg. He otherwise has been doing fine. No fevers, chills, night sweats, shortness of breath, headaches, weight change, chest pain, cough, nausea, vomiting, diarrhea, constipation, dysuria, hematuria.   PAST MEDICAL HISTORY:  1. Hypertension.  2. Chronic kidney disease, stage 4.  3. History of Bell's palsy.  4. Pulmonary hypertension.   PAST SURGICAL HISTORY:  1. Cataract surgery.  2. Cholecystectomy.  3. Renal stone.  4. Hernia repair.  5. Surgery for benign prostatic hypertrophy.   ALLERGIES: Penicillin, amoxicillin, sulfa, Biaxin, Levaquin, Macrodantin, Flagyl, Micardis and nabumetone.   HOME MEDICATIONS:  1. Hydralazine 25 mg p.o. q.i.d.  2. Allergy medications.  3. Keflex, he is continuing.   SOCIAL HISTORY: He lives with his wife and daughters. No tobacco or alcohol use.   FAMILY HISTORY: History of mother with kidney disease and father with aneurysm, coronary artery disease.   REVIEW OF SYSTEMS: [name] full 10-point review of systems was performed and pertiennt positives and negatives  presented in the history of present illness.   PHYSICAL EXAMINATION:  VITAL SIGNS: Temperature 98.2, pulse 58, blood pressure 155/67, respirations 18, pulse oximetry 95% on room air.   GENERAL: No acute distress, alert and oriented x3.   Head: Normocephalic,  atraumatic .  Eyes: No scleral icterus or conjunctivitis.   NECK: No obvious masses noted.   CHEST: Lungs clear to auscultation, moving air well.   HEART: Regular rate and rhythm. No murmurs, rubs, or gallops.   ABDOMEN: Soft, nontender, nondistended.   THIGH: Approximately 10 cm laceration with what looks like interrupted nylon sutures. There is some erythema; however, I don't believe this is cellulitis.  There is [name] small area of skin necrosis which will scab and may fall off, but no obvious purulence. I probed the wound. There is no obvious underlying abscess cavity from what I can tell.   EXTREMITIES: moves all extremities well.  Strength 5/5 all 4 extremities.   VASCULAR: 2+ DP and PT pulses.    LABORATORY DATA: Labs are as follows: Significant for [name] white cell count of 4.5, hemoglobin and hematocrit 9.3, 28.1, platelets 158. Sodium is 144, potassium is 4.4, creatinine is 2.39.   ASSESSMENT AND PLAN: [name] is [name] pleasant 81 year old male with [name] history of laceration to his left leg, otherwise relatively healthy except for kidney disease. I do not believe that with his vital signs, and white cell count, and appearance of wound that he has anything catastrophic going on. I have also probed his wound, and I don't think there is any undrained abscess. I would continue antibiotics. We will continue closely, may require debridement in the future.  ____________________________ [name]. Zain Bingman, MD cal:cbb D: 05/19/2012 09:44:01 ET T: 05/19/2012 11:45:08 ET JOB#: [id number]  cc: Cristal Deerhristopher [name]. Felicia Both, MD, <Dictator> Jarvis NewcomerHRISTOPHER [name] Magaby Rumberger MD ELECTRONICALLY SIGNED 05/21/2012 9:39

## 2014-12-28 NOTE — Discharge Summary (Signed)
PATIENT NAME:  [name], [name] MR#:  [id number] DATE OF BIRTH:  01-03-1911  DATE OF ADMISSION:  05/18/2012 DATE OF DISCHARGE:  05/21/2012  PRESENTING COMPLAINT: Left thigh redness worsening after laceration.  DISCHARGE DIAGNOSES:  1. Left thigh laceration with methicillin-resistant Staphylococcus aureus cellulitis.  2. Chronic kidney disease stage IV.   3. Hypertension.   CONDITION ON DISCHARGE: Fair.   MEDICATIONS:  1. Metamucil orally as needed.  2. Aspirin 81 mg daily.  3. Hydralazine 25 mg 4 times [name] day.  4. Zyvox 600 mg b.i.d. for six days.  5. Tylenol 500 mg 2 tablets q.8 hours as needed.   FOLLOW UP:  1. Follow up with [name] on 09/26 at 9:30.  2. Dr. Juliann PulseLundquist 09/20 at 2:15 p.m.   DISCHARGE INSTRUCTIONS: Dressing changes as per instructions.   CONSULTATION: Surgical consultation with Dr. Juliann PulseLundquist.   LABORATORY, DIAGNOSTIC AND RADIOLOGICAL DATA: White count 4.5, hemoglobin and hematocrit 9.3 and 258.1. Creatinine 2.39, sodium and potassium within normal limits. Blood cultures negative in 36 hours.   Wound culture heavy growth of MRSA. Ultrasound left lower extremity shows no evidence of thrombus.   BRIEF SUMMARY OF HOSPITAL COURSE: [name] is [name] very pleasant 81 year old Caucasian gentleman with history of hypertension, chronic kidney disease stage IV, very active at baseline trying to cut the headboard using [name] skill saw last week and accidentally had laceration on his left thigh. It was sutured at the urgent care, was started on Keflex, however, comes in with worsening cellulitis at the wound site. Patient was admitted with:  1. Left thigh cellulitis after cut wound, failed outpatient treatment with Keflex. He refused tetanus shot last week and started on IV vancomycin and ertapenem for broad-spectrum coverage. Surgery saw patient, recommended no suture removal and no further drainage at this time. Wound culture grew MRSA. Patient's antibiotics were changed to  Zyvox. He remained afebrile, white count was stable.  2. Chronic kidney disease stage IV, baseline. Monitor while on antibiotics. Medications were dosed renally.   3. Hypertension. Blood pressure, improved. Continue hydralazine p.o. and p.r.n.  4. History of Bell's palsy. Has slightly slurred speech at baseline.  5. d/c iinstructions was given to the patient's family. Instructions given by RN. Patient remained [name] FULL CODE. Hospital stay otherwise remained stable.   TIME SPENT: 40 minutes.   ____________________________ Pedro HailSona [name], MD sap:cms D: 05/22/2012 13:48:02 ET T: 05/22/2012 15:58:11 ET JOB#: [id number]  cc: [name], MD, <Dictator> Ginette PitmanJennifer [name]. Dan HumphreysWalker, MD [name]. Juliann PulseLundquist, MD [name] Lindalee Huizinga MD ELECTRONICALLY SIGNED 05/26/2012 22:15

## 2015-01-01 NOTE — H&P (Signed)
PATIENT NAME:  [name], Nimrod A MR#:  [id number] DATE OF BIRTH:  [birth year]  DATE OF ADMISSION:  09/14/2013  PRIMARY CARE PHYSICIAN: Ronna PolioJennifer Walker.   HISTORY OF PRESENTING ILLNESS: A 81 year old Caucasian male patient with history of hypertension, CKD, presents to the Emergency Room with complaints of cough, congestion in his chest and weakness. The patient lives alone at home and normally ambulates with a walker, but has one of the family members with him at all times. Has had cough and chest congestion over the past week. Was seen by primary care physician 4 days prior; was given a prescription for doxycycline to be started if he does not improve. He did receive a dose of doxycycline earlier today, but the patient had fever at home with severe weakness, unable to get out of bed, and was brought to the Emergency Room by his 2 daughters. Here, the patient has been found to have fever of 101.2. Chest  x-ray showing some early infiltrate in the left lower area and perihilar infiltrate, and is being admitted to the hospitalist service. The patient was last admitted to the hospital in September 2013 for pneumonia and seems to have improved well at that time.   He has had green productive sputum. No chest pain. No orthopnea or edema. Poor appetite. No diarrhea or dysuria. No recent change in medications other than the doxycycline started earlier today.   No sick contacts. Did receive a flu vaccine earlier this season.   PAST MEDICAL HISTORY: 1.  Hypertension.  2.  CKD stage III.  3.  Bell's palsy in the past with residual slurred speech.  4.  Moderate pulmonary hypertension.   PAST SURGICAL HISTORY: Cataract surgery, cholecystectomy, hernia repair.   ALLERGIES: PENICILLIN, AMOXICILLIN, SULFA, BIAXIN, LEVAQUIN, MACRODANTIN, METRONIDAZOLE, NABUMETONE AND MICARDIS. THE DAUGHTER AT BEDSIDE MENTIONS THAT HE HAD LIFE-THREATENING ANAPHYLAXIS TO ONE OF THESE MEDICATIONS, BUT NOT SURE WHICH ONE IT WAS.    HOME MEDICATIONS: Include:  1.  Hydralazine 25 mg 4 times a day.  2.  Cholecalciferol 100 International Units oral once a day.  3.  Ferrous fumarate 325 mg oral once a day.  4.  Lasix 20 mg oral once a day as needed for swelling.  5.  Multivitamin 1 tablet oral once a day. 6.  Vitamin A 1 tablet oral once a day. 7.  Zinc 1 tablet oral once a day.   SOCIAL HISTORY: The patient smoked in the past but quit in 60s. No alcohol. No illicit drugs. Presently lives alone with family helping out with activities of daily living. Ambulates with a walker.   CODE STATUS:  DNR/DNI.   FAMILY HISTORY: Both parents died in 4660s. Mother had chronic kidney disease.   REVIEW OF SYSTEMS:  CONSTITUTIONAL:  He complains of some fatigue, but overall states that he feels well.  EYES: No blurred vision, pain or redness.  ENT:  Has decreased hearing.  RESPIRATORY: Has cough with green productive sputum. No shortness of breath.  CARDIOVASCULAR: No chest pain, orthopnea, edema.  GASTROINTESTINAL: No nausea, vomiting, diarrhea or abdominal pain.  GENITOURINARY: No dysuria, hematuria or frequency.  ENDOCRINE: No polyuria, nocturia or thyroid problems.  HEMATOLOGIC AND LYMPHATIC: No anemia, easy bruising or bleeding.  INTEGUMENTARY: No acne, rash or lesion.  MUSCULOSKELETAL: Has some arthritis.  NEUROLOGICAL: Had severe weakness, which was generalized. No focal weakness or seizures.  PSYCHIATRIC:  No anxiety or depression.   PHYSICAL EXAMINATION: VITAL SIGNS: Shows temperature of 101.3, pulse of 86, blood pressure 131/55,  saturating 96% on room air.  GENERAL: Elderly, frail Caucasian male patient lying in bed, seems comfortable, conversational.  PSYCHIATRIC: Alert, awake, pleasant.  HEENT: Atraumatic, normocephalic. Oral mucosa moist and pink. Edentulous.  NECK: Supple. No thyromegaly. No palpable lymph nodes. Trachea midline. No carotid bruit or JVD.  CARDIOVASCULAR: S1, S2, without any murmurs. Peripheral  pulses 2+. No edema.  RESPIRATORY: Has rhonchi on the left side with coarse breath sounds bilaterally.  GASTROINTESTINAL: Soft abdomen, nontender. Bowel sounds present. No hepatosplenomegaly palpable.   GENITOURINARY: No CVA tenderness or bladder distention.  SKIN: Warm and dry. No petechiae, rash or ulcers.  MUSCULOSKELETAL: No joint swelling, redness, effusion of the large joints. Normal muscle tone.  NEUROLOGICAL: Motor strength 5/5 in the upper and lower extremities.  LYMPHATIC: No cervical lymphadenopathy.   LABORATORY DATA: Show glucose of 108, BUN 28, creatinine 1.97, sodium 133, potassium 4.9. Troponin 0.03. WBC 9.3, hemoglobin 10.1, platelets of 191.   Chest x-ray shows some early left lower lobe infiltrate and bronchitic changes.   ASSESSMENT AND PLAN: 1.  Community-acquired pneumonia of the left lower lobe with fever 34.52 in a 81 year old patient. We will start him on ceftriaxone and doxycycline, as he is ALLERGIC TO LEVAQUIN. The patient did not have any problems with allergy during prior admission to these medications. We will send for sputum cultures. No sepsis. Check an influenza A and B.  2.  Chronic kidney disease stage III. The patient's creatinine is 1.97. This is improved from his prior creatinine a year back, which was greater than 2.  3.  Hypertension, mildly elevated at 140s. Will continue his hydralazine from home.  4.  Anemia of chronic disease. Baseline hemoglobin around 9 to 10, which is stable.  5.  Deep venous thrombosis prophylaxis with Lovenox.   CODE STATUS: DNR/DNI.   Time Spent today on this case was 40 minutes.     ____________________________ Molinda Bailiff Cameka Rae, MD srs:dmm D: 09/14/2013 21:19:45 ET T: 09/14/2013 22:02:05 ET JOB#: [id number]  cc: Wardell Heath R. Josselyne Onofrio, MD, <Dictator> Ginette Pitman. Dan Humphreys, MD Wardell Heath [place] Bali MD ELECTRONICALLY SIGNED 09/17/2013 11:14

## 2015-01-01 NOTE — Discharge Summary (Signed)
PATIENT NAME:  Pedro [name], Pedro [name] MR#:  [id number] DATE OF BIRTH:  Jun 19, 1911  DATE OF ADMISSION:  09/14/2013 DATE OF DISCHARGE:  09/16/2013  For [name] detailed note, please look at the history and physical done on admission by Dr. Elpidio AnisSudini.   DIAGNOSES AT DISCHARGE: As follows:  1.  Generalized weakness secondary to pneumonia. Pneumonia, likely community-acquired.  2.  Hypertension.  3.  Chronic kidney disease stage III.  The patient is being discharged on [name] low-sodium diet.   ACTIVITY: As tolerated.  FOLLOW-UP:  With Dr. Ronna PolioJennifer [name] in the next 1 to 2 weeks.  DISCHARGE MEDICATIONS: Hydralazine 25 mg q.i.d., vitamin D 1000 international units daily, Lasix 20 mg daily as needed, ferrous fumarate 325 mg daily, multivitamin daily, vitamin E daily, zinc daily, Ceftin 250 mg b.i.d. x 7 days, doxycycline 100 mg b.i.d. x 7 days.   PERTINENT STUDIES DONE DURING THE HOSPITAL COURSE:  Are as follows: [name] chest x-ray done on admission showing bronchitic changes with minimal bibasilar atelectasis.   The patient's blood cultures essentially negative. Sputum cultures growing gram-negative rod. ID and sensitivity is pending.   HOSPITAL COURSE: This is 81 year old male with medical problems as mentioned above, presented to the hospital with generalized weakness and cough and congestion and suspected to have pneumonia.   PROBLEM: 1.  Pneumonia. This was likely community-acquired pneumonia. The patient had had [name] cough, congestion and lethargy and weakness for [name] few days. He was seen by his primary care physician and referred to the hospital. The patient was started on oral antibiotics with doxycycline at home, but was not improving. Was admitted and started on IV ceftriaxone and oral doxycycline. The patient has significantly improved with antibiotics as mentioned. He has multiple allergies. Presently, he is being discharged on oral Ceftin and doxycycline for another week. He will follow up with his primary care  physician next week. The patient's sputum cultures growing multiple organisms, but the sensitivities and identification is currently pending. I will follow these up at [name] later time. If there is anything abnormal, I will call the patient back. Since clinically this patient is doing significantly well, I will be discharging him home.  2.  Chronic kidney disease stage III. The patient's creatinine remained at baseline. This is further to be followed by his primary care physician.  3.  Hypertension. The patient remained hemodynamically stable on his oral hydralazine and he will continue that.   CODE STATUS: The patient is a DO NOT INTUBATE/DO NOT RESUSCITATE. He is being discharged home.  TIME SPENT: 40 minutes    ____________________________ Pedro PancakeVivek J. Cherlynn KaiserSainani, MD vjs:ce D: 09/16/2013 17:00:29 ET T: 09/16/2013 20:28:50 ET JOB#: [id number]  cc: Pedro PancakeVivek J. Cherlynn KaiserSainani, MD, <Dictator> Ginette PitmanJennifer [name]. Dan HumphreysWalker, MD [place] SirenVIVEK J SAINANI MD ELECTRONICALLY SIGNED 10/07/2013 11:21

## 2015-01-02 NOTE — Discharge Summary (Signed)
PATIENT NAME:  [name], [name] MR#:  [id number] DATE OF BIRTH:  05/28/1911  DATE OF ADMISSION:  02/08/2012 DATE OF DISCHARGE:  02/10/2012  DIAGNOSES:  1. Pneumonia. 2. Hypertension.  3. Chronic kidney disease stage IV. 4. Anemia of chronic disease.  5. Thrombocytopenia.  6. Moderate pulmonary hypertension. 7. Elevated troponins due to demand ischemia.  8. History of Bell's palsy.   DISPOSITION: [name], [name], in 1 to 2 weeks after discharge.   DIET: Low sodium.   ACTIVITY: As tolerated.   DISCHARGE MEDICATIONS:  1. Doxycycline 100 mg b.i.d. for seven days. 2. Guaifenesin DM 5 mL q.4-6 hours p.r.n. cough.  3. Metamucil orally as needed. 4. Aspirin 81 mg daily. 5. Hydralazine 25 mg b.i.d.   LABORATORY, DIAGNOSTIC AND RADIOLOGICAL DATA: Chest x-ray showed patchy perihilar infiltrate suspicious for pneumonia. Mild increased density in the lung bases compatible with atelectasis or pneumonia. Normal size heart. Sputum culture: Moderate WBCs, few gram-positive cocci in pairs. Blood culture negative so far. Normal white count. Hemoglobin ranging from 9.5 to 9. Platelets 135 to 128. Creatinine 2.40 to 2.29. Troponin 0.06.   HOSPITAL COURSE: Patient is [name] 81 year old male with past medical history of hypertension, Bell's palsy, chronic kidney disease stage IV, moderate pulmonary hypertension who presented with cough, weakness, and low-grade fever. He was found to have perihilar pneumonia. Blood cultures were sent which showed no growth so far. Sputum culture showed many white cells, very few gram-positive cocci and gram-positive rods. Patient was empirically started on Rocephin and doxycycline and p.r.n. antitussives with good improvement of his symptoms. Patient remained afebrile during the hospitalization. He had normal white count. Patient's hypertension remained stable. He had very minimally elevated  troponin possibly due to demand ischemia. Patient denied any chest pain. Patient's chronic kidney disease remained stable during the hospitalization. As per the patient's daughter whenever the patient eats sometimes he has severe bouts of coughing. If patient continues to do so he may need an outpatient swallow evaluation.   TOTAL TIME SPENT: 45 minutes.   ____________________________ Darrick MeigsSangeeta Brylyn Novakovich, MD sp:cms D: 02/10/2012 11:33:09 ET T: 02/10/2012 12:17:00 ET JOB#: [id number]  cc: Darrick MeigsSangeeta Destyn Schuyler, MD, <Dictator> Ginette PitmanJennifer [name]. Dan HumphreysWalker, MD Darrick MeigsSANGEETA Jullianna Gabor MD ELECTRONICALLY SIGNED 02/11/2012 6:55

## 2015-01-02 NOTE — H&P (Signed)
PATIENT NAME:  [name], [name] MR#:  [id number] DATE OF BIRTH:  1911/09/01  DATE OF ADMISSION:  02/08/2012  PRIMARY CARE PHYSICIAN: Ronna PolioJennifer Walker, MD  CHIEF COMPLAINT: Coughing. History is obtained from the patient and the daughters.   HISTORY OF PRESENT ILLNESS: This is a 81 year old male who has history of Bell's palsy, he has history of hypertension, benign prostatic hypertrophy, and chronic kidney disease, almost stage IV now. He had an echo done in June 2012. At that time, it showed that the patient had an ejection fraction of greater than 55%, some impaired LV relaxation, and mild to moderate pulmonary hypertension. Today he was brought into the emergency room because he has been coughing for the last four days. He says he is bringing up a terrible amount of phlegm, like darkish color phlegm. He was complaining of weakness, some low-grade fever. He denies any shortness of breath, any dyspnea on exertion. He denies any orthopnea. He denies any chest pain or any pleuritic chest pain. He says he has a mild amount of swelling in his lower extremity and that may be because of his bad kidneys. In the Emergency Room, on his initial work-up, he had a normal white count of 7.4. He had a chest x-ray which was reported as patchy perihilar infiltrate suspicious for pneumonia. He got a dose of ceftriaxone and doxycycline in the emergency room. He was found to have elevated BNP in the emergency room which could be secondary to pulmonary hypertension. He denies any shortness of breath. He denies any nausea or vomiting, any abdominal pain.  REVIEW OF SYSTEMS: Positive for weakness. He had low-grade fever at home. He has macular degeneration. He is hard of hearing. No headache. No dizziness. He is complaining of cough. No painful respiration. No chronic obstructive pulmonary disease. No dyspnea on exertion, orthopnea, palpitations, or syncope. No nausea, vomiting, diarrhea, abdominal pain, or gastrointestinal bleed.  No dysuria. No frequency. No thyroid problems. No anemia. No rash. No joint pain. He is complaining of mild lower extremity swelling. No focal numbness or weakness. No anxiety or depression.   PAST MEDICAL HISTORY:  1. Hypertension.  2. Chronic kidney disease, which has progressive to chronic kidney disease stage IV. 3. History of Bell's palsy. 4. Moderate pulmonary hypertension. Last echo was done in June 2012 with ejection fraction greater than 55% and right ventricular pressure is 40 to 50 mmHg.  PAST SURGICAL HISTORY:  1. Cholecystectomy.  2. Kidney stones. 3. Surgery for benign prostatic hypertrophy. 4. Hernia repair. 5. Cataract surgery.   DRUG ALLERGIES: Amoxicillin, Biaxin, Levaquin, Macrodantin, metronidazole, Micardis, nabumetone, Neosporin, penicillin and sulfa.   HOME MEDICATIONS:  1. Aspirin 81 mg a day. 2. Hydralazine 25 mg four times daily.  SOCIAL HISTORY: He lives with his wife. No smoking or alcohol use.   FAMILY HISTORY: No significant medical problems in the family.   PHYSICAL EXAMINATION:   VITAL SIGNS: Temperature 97.6, heart rate 89, respiratory rate 18, blood pressure 154/68 and saturating 93% on room air. Currently his heart rate is 81, blood pressure 158/74, respiratory rate 24, and saturation 100% on 2 liters nasal cannula.   GENERAL: This is an elderly Caucasian male. He is lying supine in bed without any shortness of breath. No acute distress.   HEENT: Bilateral pupils are equal but small and sluggishly reactive. Extraocular muscles intact. No scleral icterus. No conjunctivitis. Oral mucosa is moist.   NECK: No thyroid tenderness, enlargement or nodule. Neck is supple. No masses and nontender. No  adenopathy. No JVD. No carotid bruit.   LUNGS: Chest is fairly clear. Bilateral breath sounds are clear, maybe minimal crackles I could hear at the bases, very minimal. Normal respiratory effort. Not using accessory muscles of respiration.   HEART: Heart  sounds are regular. No murmur. Good peripheral pulses. Mild lower extremity edema.   ABDOMEN: Soft and nontender. Normal bowel sounds. No hepatomegaly. No bruit. No masses.   RECTAL: Deferred.   NEUROLOGIC: He is awake, alert, and oriented to time, place, and person. Cranial nerves are intact. He is moving bilateral lower extremities against gravity, left upper extremity against gravity. He cannot move the right upper extremity against gravity because of chronic right shoulder rotator cuff problem.   EXTREMITIES: No cyanosis. No clubbing.   SKIN: He has mild lower extremity edema.   LABS/STUDIES: White count 7.4, hemoglobin 9.5, and platelet count 135,000. His last hemoglobin was 10.3 in June 2012. BMP: Sodium 142, potassium 4, BUN 42, creatinine 2.4. Troponin negative. BNP 3163.   Chest x-ray showed patchy infiltrates bilaterally suspicious for pneumonia. Heart size is normal.  His EKG shows that he has left axis deviation, sinus rhythm. He has a right bundle branch block.   IMPRESSION:  1. Pneumonia bilaterally, less likely congestive heart failure. 2. Hypertension. 3. Chronic kidney disease stage IV. 4. Anemia most likely secondary to chronic kidney disease.  5. Moderate pulmonary hypertension. 6. History of Bell's palsy.   PLAN: A 81 year old male who has history of hypertension, he has history of chronic kidney disease, history of Bell's palsy, and his previous echocardiogram in June 2012 showed he had a normal ejection fraction, greater than 55%, but he has moderate pulmonary hypertension. He presented with cough going on for four days with junky phlegm and low-grade fever and weakness, but he denies any shortness of breath, any orthopnea, or any dyspnea on exertion. He is lying flat in the emergency room without any shortness of breath. Chest sounds to be quite clear. He has elevated BNP but this could be related to pulmonary hypertension or chronic kidney disease, less likely  because of congestive heart failure. Chest x-ray shows bilateral patchy infiltrates. We will treat as pneumonia. He is allergic to multiple antibiotics. He got ceftriaxone in the emergency room and we will give him doxycycline for atypical coverage. We will continue his aspirin and hydralazine, we will check serial cardiac enzymes on him, and continue to monitor his creatinine. He has some mild lower extremity edema which may be because of his chronic kidney disease.  The plan was discussed with the family members.  TIME SPENT ON ADMISSION AND COORDINATION OF CARE: 50 minutes.  ____________________________ Fredia Sorrow, MD ag:slb D: 02/08/2012 16:52:13 ET T: 02/08/2012 17:10:59 ET JOB#: [id number]  cc: Fredia Sorrow, MD, <Dictator> Ginette Pitman. Dan Humphreys, MD Fredia Sorrow MD ELECTRONICALLY SIGNED 02/25/2012 11:49

## 2015-01-04 ENCOUNTER — Ambulatory Visit: Payer: Self-pay

## 2015-01-07 ENCOUNTER — Ambulatory Visit (INDEPENDENT_AMBULATORY_CARE_PROVIDER_SITE_OTHER): Payer: Medicare Other | Admitting: Podiatry

## 2015-01-07 DIAGNOSIS — M79676 Pain in unspecified toe(s): Secondary | ICD-10-CM

## 2015-01-07 DIAGNOSIS — B351 Tinea unguium: Secondary | ICD-10-CM

## 2015-01-07 NOTE — Progress Notes (Signed)
Patient presents with thick nailbeds and pain 1-5 both feet that he cannot cut.  Neurovascular status intact with thick painful nailbeds 1-5 both feet that are painful.  Mycotic nail infection with pain 1-5 both feet.  Debridement painful nailbeds 1-5 both feet with no iatrogenic bleeding noted.  

## 2015-02-08 IMAGING — CR DG CHEST 2V
1 series · 3 of 3 positions shown · non-contrast
Comparison: 02/08/2012

CLINICAL DATA: Productive cough, fever

EXAM:
CHEST  2 VIEW

[Series 1: ap · 0.17mm/px · 3 of 3 slices shown]
[im 1/3]
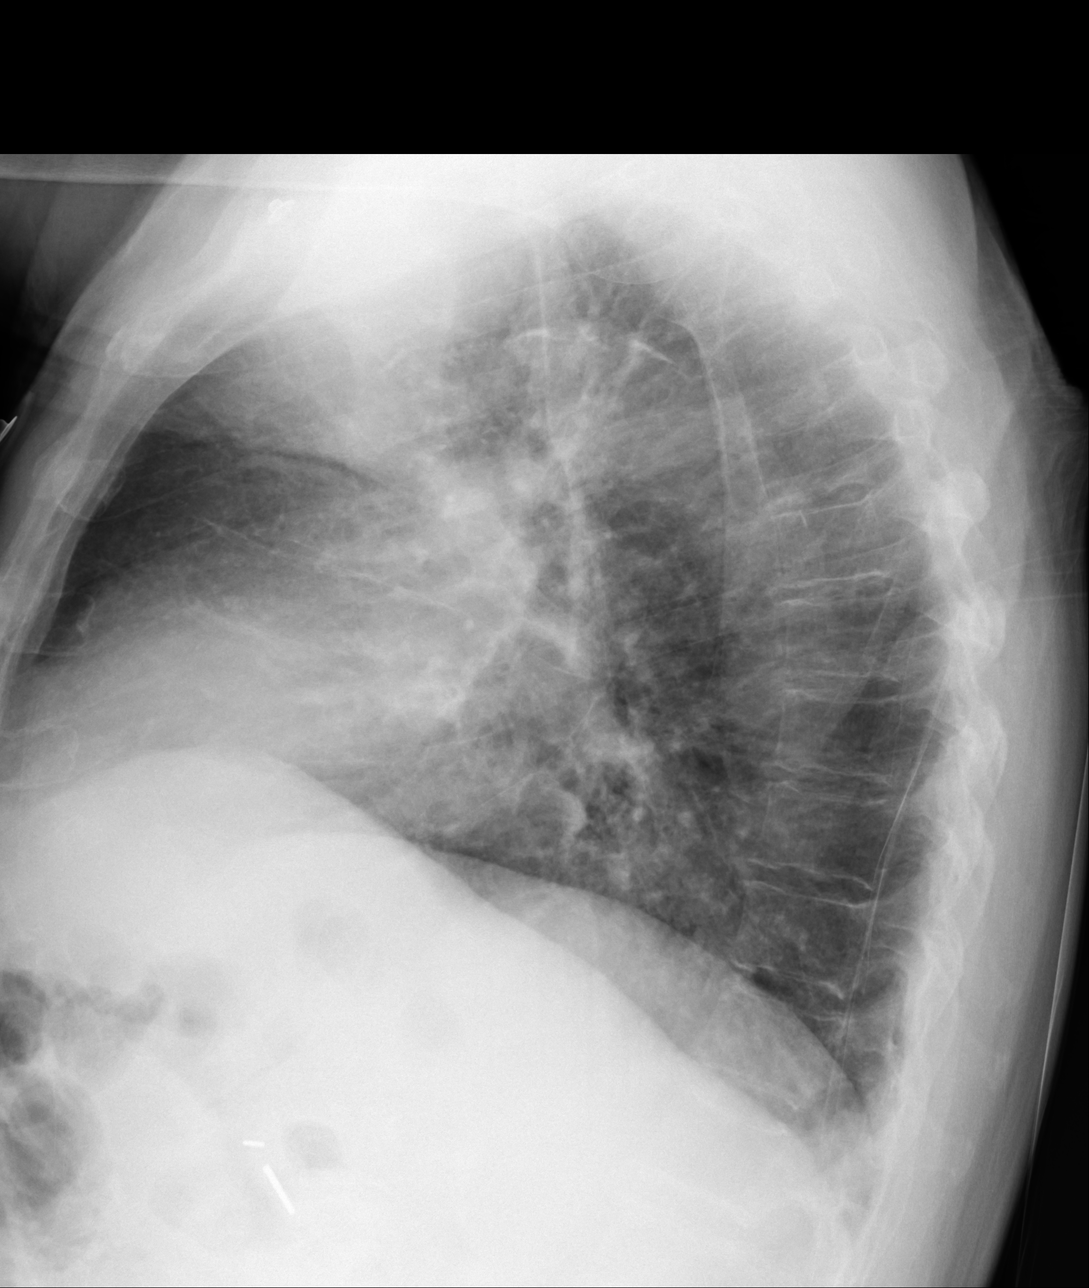
[im 2/3]
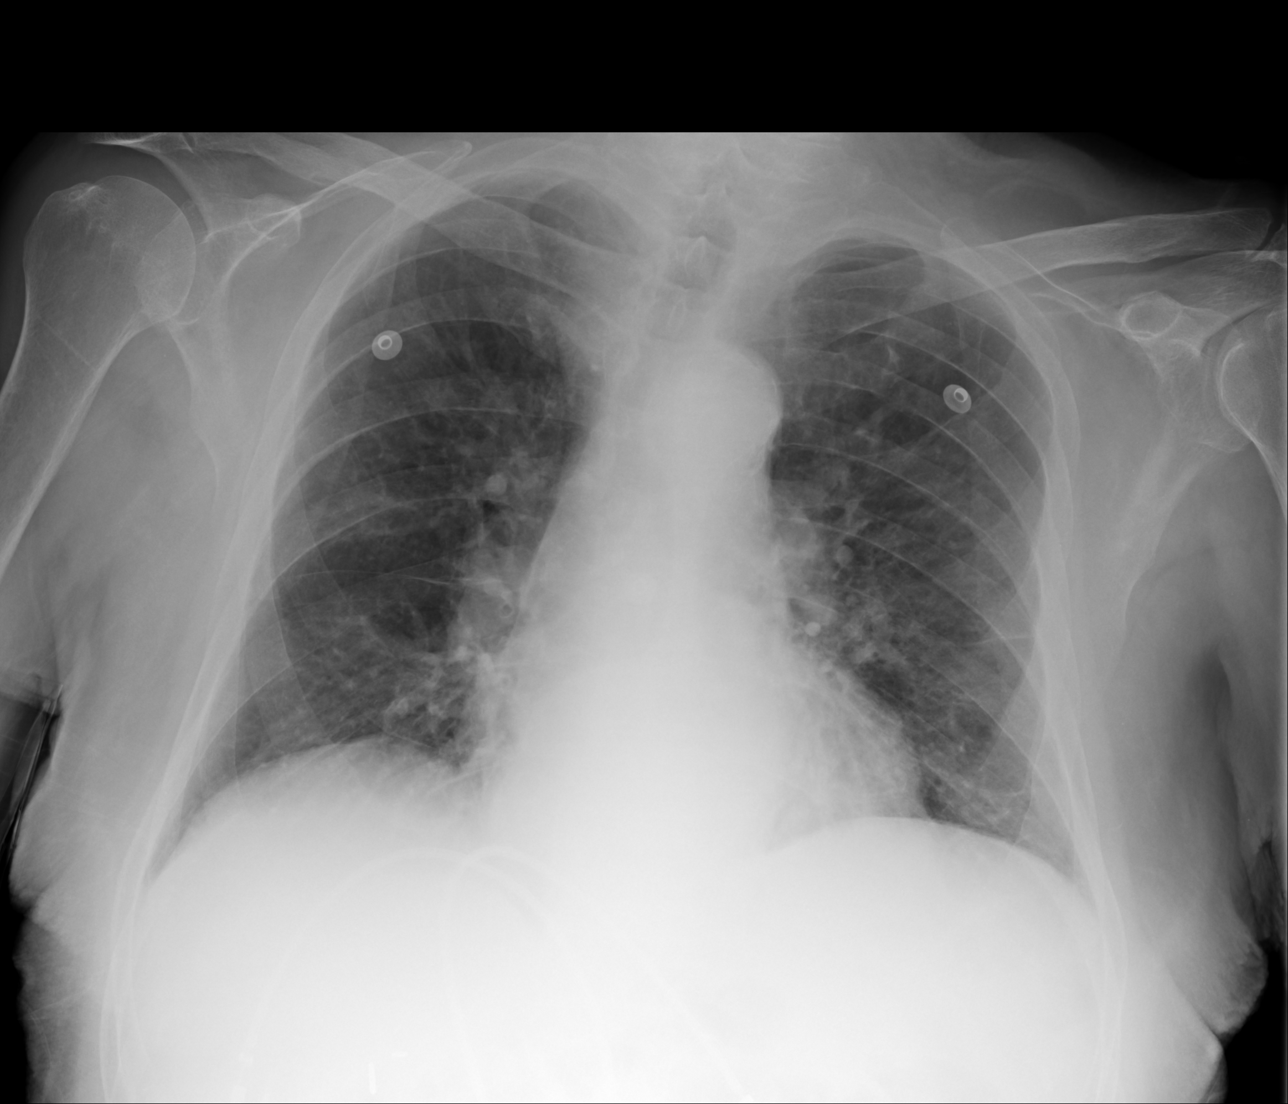
[im 3/3]
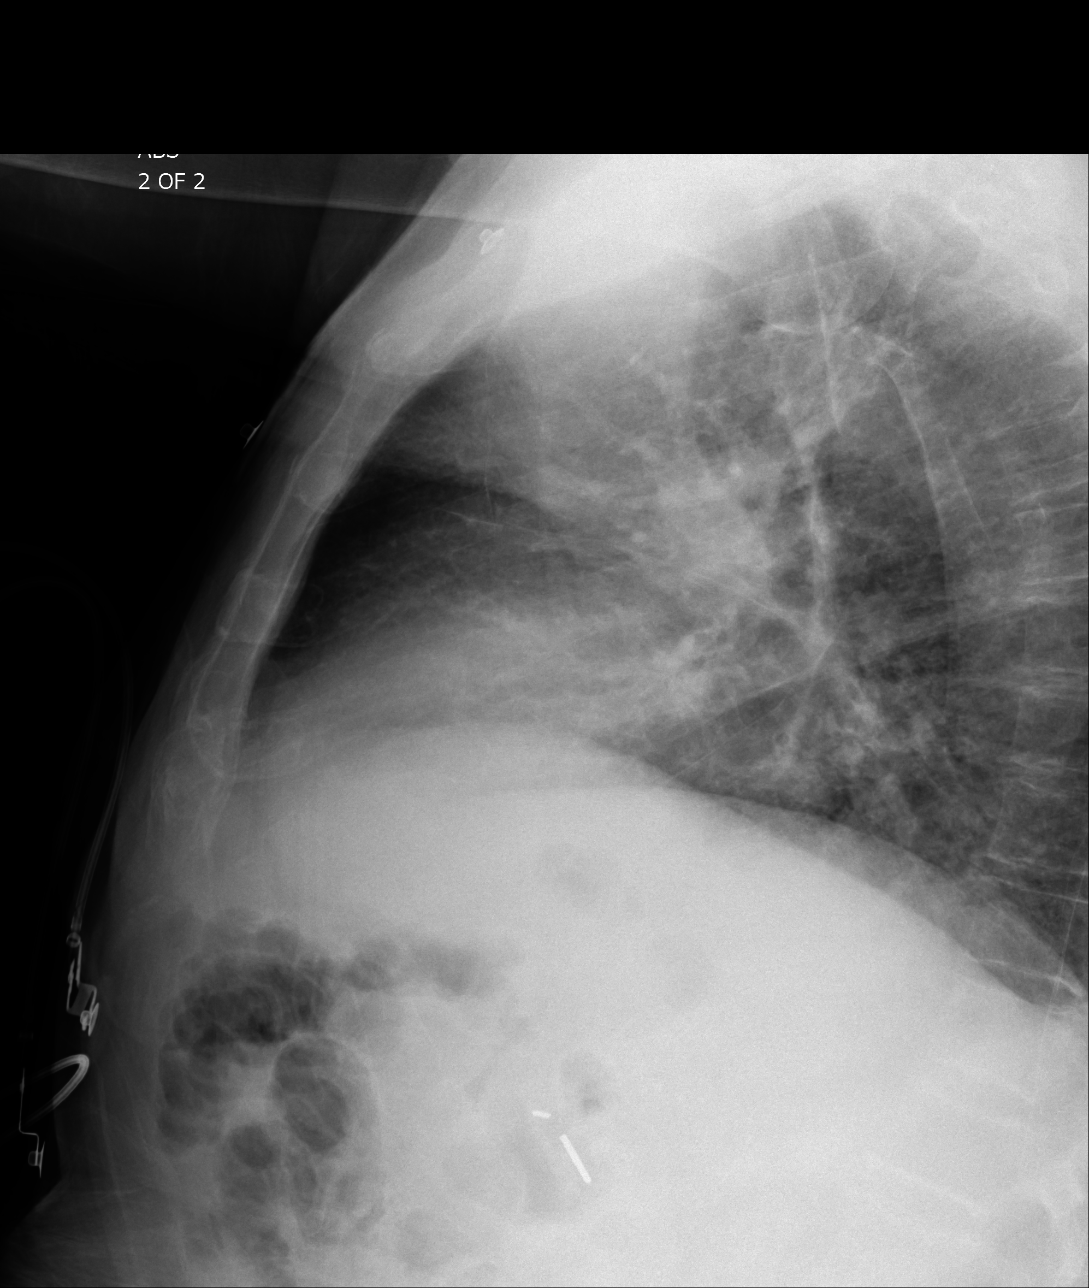

[3 of 3 positions shown; findings below may reference images not displayed]

FINDINGS: Normal heart size and pulmonary vascularity.

Tortuous thoracic aorta with atherosclerotic calcification arch.

Mild bronchitic changes.

Slight chronic accentuation of perihilar markings particularly on
left.

Minimal bibasilar atelectasis.

Lungs otherwise clear.

No pleural effusion or pneumothorax.

No acute osseous findings.
IMPRESSION: Bronchitic changes with minimal bibasilar atelectasis.

## 2015-03-03 ENCOUNTER — Ambulatory Visit (INDEPENDENT_AMBULATORY_CARE_PROVIDER_SITE_OTHER): Payer: Medicare Other | Admitting: Internal Medicine

## 2015-03-03 ENCOUNTER — Encounter: Payer: Self-pay | Admitting: Internal Medicine

## 2015-03-03 VITALS — BP 155/61 | HR 69 | Temp 98.1°F | Ht 63.0 in | Wt 152.2 lb

## 2015-03-03 DIAGNOSIS — D649 Anemia, unspecified: Secondary | ICD-10-CM | POA: Diagnosis not present

## 2015-03-03 DIAGNOSIS — Z79899 Other long term (current) drug therapy: Secondary | ICD-10-CM | POA: Diagnosis not present

## 2015-03-03 DIAGNOSIS — I159 Secondary hypertension, unspecified: Secondary | ICD-10-CM

## 2015-03-03 DIAGNOSIS — R531 Weakness: Secondary | ICD-10-CM | POA: Diagnosis not present

## 2015-03-03 LAB — CBC WITH DIFFERENTIAL/PLATELET
BASOS PCT: 0.4 % (ref 0.0–3.0)
Basophils Absolute: 0 10*3/uL (ref 0.0–0.1)
EOS ABS: 0.2 10*3/uL (ref 0.0–0.7)
Eosinophils Relative: 3.9 % (ref 0.0–5.0)
HCT: 30.9 % — ABNORMAL LOW (ref 39.0–52.0)
Hemoglobin: 10.1 g/dL — ABNORMAL LOW (ref 13.0–17.0)
LYMPHS PCT: 23.8 % (ref 12.0–46.0)
Lymphs Abs: 1 10*3/uL (ref 0.7–4.0)
MCHC: 32.9 g/dL (ref 30.0–36.0)
MCV: 88.5 fl (ref 78.0–100.0)
Monocytes Absolute: 0.3 10*3/uL (ref 0.1–1.0)
Monocytes Relative: 7.7 % (ref 3.0–12.0)
NEUTROS PCT: 64.2 % (ref 43.0–77.0)
Neutro Abs: 2.6 10*3/uL (ref 1.4–7.7)
Platelets: 162 10*3/uL (ref 150.0–400.0)
RBC: 3.49 Mil/uL — ABNORMAL LOW (ref 4.22–5.81)
RDW: 16.6 % — ABNORMAL HIGH (ref 11.5–15.5)
WBC: 4.1 10*3/uL (ref 4.0–10.5)

## 2015-03-03 LAB — COMPREHENSIVE METABOLIC PANEL
ALT: 10 U/L (ref 0–53)
AST: 17 U/L (ref 0–37)
Albumin: 3.9 g/dL (ref 3.5–5.2)
Alkaline Phosphatase: 63 U/L (ref 39–117)
BILIRUBIN TOTAL: 0.3 mg/dL (ref 0.2–1.2)
BUN: 39 mg/dL — ABNORMAL HIGH (ref 6–23)
CALCIUM: 9 mg/dL (ref 8.4–10.5)
CO2: 23 mEq/L (ref 19–32)
Chloride: 113 mEq/L — ABNORMAL HIGH (ref 96–112)
Creatinine, Ser: 2.08 mg/dL — ABNORMAL HIGH (ref 0.40–1.50)
GFR: 31.24 mL/min — ABNORMAL LOW (ref >60.00)
Glucose, Bld: 103 mg/dL — ABNORMAL HIGH (ref 70–99)
POTASSIUM: 4.6 meq/L (ref 3.5–5.1)
Sodium: 141 mEq/L (ref 135–145)
Total Protein: 6.2 g/dL (ref 6.0–8.3)

## 2015-03-03 LAB — POCT URINALYSIS DIPSTICK
BILIRUBIN UA: NEGATIVE
Glucose, UA: NEGATIVE
KETONES UA: NEGATIVE
Leukocytes, UA: NEGATIVE
Nitrite, UA: NEGATIVE
Protein, UA: NEGATIVE
RBC UA: NEGATIVE
Spec Grav, UA: 1.01
Urobilinogen, UA: 0.2
pH, UA: 5

## 2015-03-03 LAB — VITAMIN B12: Vitamin B-12: 459 pg/mL (ref 211–911)

## 2015-03-03 NOTE — Progress Notes (Signed)
Subjective:    Patient ID: [name], male    DOB: [birth year], 81 y.o.   MRN: [id number]  HPI  81YO male presents for follow up.  Having some episodes of weakness. Last episode was 2 weeks ago. No syncope. Symptoms improved with rest. No chest pain, dyspnea. No other symptoms. Appetite good. Urine stream is small. Bowels moving well. Lives alone, however son or daughter present to watch over him 24/7. No recent falls.  Past medical, surgical, family and social history per today's encounter.  Review of Systems  Constitutional: Negative for fever, chills, activity change, appetite change, fatigue and unexpected weight change.  Eyes: Negative for visual disturbance.  Respiratory: Negative for cough and shortness of breath.   Cardiovascular: Negative for chest pain, palpitations and leg swelling.  Gastrointestinal: Negative for nausea, vomiting, abdominal pain, diarrhea, constipation and abdominal distention.  Genitourinary: Negative for dysuria, urgency and difficulty urinating.  Musculoskeletal: Positive for back pain (occasional mild) and gait problem. Negative for arthralgias.  Skin: Negative for color change and rash.  Hematological: Negative for adenopathy.  Psychiatric/Behavioral: Negative for sleep disturbance and dysphoric mood. The patient is not nervous/anxious.        Objective:    BP 155/61 mmHg  Pulse 69  Temp(Src) 98.1 F (36.7 C) (Oral)  Ht  (1.6 m)  Wt 152 lb 4 oz (69.06 kg)  BMI 26.98 kg/m2  SpO2 99% Physical Exam  Constitutional: He is oriented to person, place, and time. He appears well-developed and well-nourished. No distress.  HENT:  Head: Normocephalic and atraumatic.  Right Ear: External ear normal. Decreased hearing is noted.  Left Ear: External ear normal. Decreased hearing is noted.  Nose: Nose normal.  Mouth/Throat: Oropharynx is clear and moist. No oropharyngeal exudate.  Eyes: Conjunctivae and EOM are normal. Pupils are  equal, round, and reactive to light. Right eye exhibits no discharge. Left eye exhibits no discharge. No scleral icterus.  Neck: Normal range of motion. Neck supple. No tracheal deviation present. No thyromegaly present.  Cardiovascular: Normal rate, regular rhythm and normal heart sounds.  Exam reveals no gallop and no friction rub.   No murmur heard. Pulmonary/Chest: Effort normal and breath sounds normal. No accessory muscle usage. No tachypnea. No respiratory distress. He has no decreased breath sounds. He has no wheezes. He has no rhonchi. He has no rales. He exhibits no tenderness.  Musculoskeletal: Normal range of motion. He exhibits no edema.  Lymphadenopathy:    He has no cervical adenopathy.  Neurological: He is alert and oriented to person, place, and time. No cranial nerve deficit. Coordination normal.  Skin: Skin is warm and dry. No rash noted. He is not diaphoretic. No erythema. No pallor.  Psychiatric: He has a normal mood and affect. His behavior is normal. Judgment and thought content normal.          Assessment & Plan:   Problem List Items Addressed This Visit      Unprioritized   Anemia    Will recheck CBC with labs today.      Relevant Orders   CBC with Differential/Platelet   HTN (hypertension)    BP Readings from Last 3 Encounters:  03/03/15 155/61  11/23/14 153/64  10/22/14 120/62   BP well controlled for his age. Renal function with labs.      Relevant Orders   Comprehensive metabolic panel   POCT Urinalysis Dipstick   Weakness - Primary    Intermittent mild weakness. Will check CBC, B12  and CMP with labs today. He is supervised in the home 24/7. Will continue to monitor.      Relevant Orders   B12       Return in about 3 months (around 06/03/2015) for Recheck.

## 2015-03-03 NOTE — Assessment & Plan Note (Signed)
Intermittent mild weakness. Will check CBC, B12 and CMP with labs today. He is supervised in the home 24/7. Will continue to monitor.

## 2015-03-03 NOTE — Assessment & Plan Note (Signed)
Will recheck CBC with labs today. 

## 2015-03-03 NOTE — Assessment & Plan Note (Signed)
BP Readings from Last 3 Encounters:  03/03/15 155/61  11/23/14 153/64  10/22/14 120/62   BP well controlled for his age. Renal function with labs.

## 2015-03-03 NOTE — Progress Notes (Signed)
Pre visit review using our clinic review tool, if applicable. No additional management support is needed unless otherwise documented below in the visit note. 

## 2015-03-03 NOTE — Patient Instructions (Signed)
Labs today

## 2015-03-04 ENCOUNTER — Encounter: Payer: Self-pay | Admitting: *Deleted

## 2015-03-22 ENCOUNTER — Encounter: Payer: Self-pay | Admitting: Internal Medicine

## 2015-03-22 ENCOUNTER — Ambulatory Visit (INDEPENDENT_AMBULATORY_CARE_PROVIDER_SITE_OTHER): Payer: Medicare Other | Admitting: Internal Medicine

## 2015-03-22 ENCOUNTER — Telehealth: Payer: Self-pay | Admitting: Internal Medicine

## 2015-03-22 VITALS — BP 168/64 | HR 65 | Temp 97.7°F | Ht 63.0 in | Wt 150.2 lb

## 2015-03-22 DIAGNOSIS — S20212A Contusion of left front wall of thorax, initial encounter: Secondary | ICD-10-CM | POA: Insufficient documentation

## 2015-03-22 NOTE — Assessment & Plan Note (Signed)
Mild contusion of left rib. Encouraged use of Tylenol prn for pain. Follow up if symptoms not improving. Encouraged falls prevention.

## 2015-03-22 NOTE — Patient Instructions (Signed)
Start Tylenol  up to three times daily for pain.  Follow up if symptoms are not improving.

## 2015-03-22 NOTE — Telephone Encounter (Signed)
FYI, appoint scheduled with you.

## 2015-03-22 NOTE — Progress Notes (Signed)
   Subjective:    Patient ID: Ranae PlumberJohnie Allen Arvizu, male    DOB: 04/30/1911, [age] y.o.   MRN: [id number]  HPI [age]YO male presents for acute visit.  Fall - Walking to bathroom last night, tripped and fell, hitting left side below ribs. Having mild pain at site. Pain described as aching. Made worse with deep inspiration and movement. Not taking anything for pain.   Past medical, surgical, family and social history per today's encounter.  Review of Systems  Constitutional: Negative for fever, chills, activity change, appetite change, fatigue and unexpected weight change.  Eyes: Negative for visual disturbance.  Respiratory: Negative for cough, shortness of breath, wheezing and stridor.   Cardiovascular: Negative for chest pain, palpitations and leg swelling.  Gastrointestinal: Negative for abdominal pain and abdominal distention.  Genitourinary: Negative for dysuria, urgency and difficulty urinating.  Musculoskeletal: Positive for myalgias, back pain and arthralgias. Negative for gait problem.  Skin: Negative for color change and rash.  Hematological: Negative for adenopathy.  Psychiatric/Behavioral: Negative for sleep disturbance and dysphoric mood. The patient is not nervous/anxious.        Objective:    BP 168/64 mmHg  Pulse 65  Temp(Src) 97.7 F (36.5 C) (Oral)  Ht 5\' 3"  (1.6 m)  Wt 150 lb 4 oz (68.153 kg)  BMI 26.62 kg/m2  SpO2 100% Physical Exam  Constitutional: He is oriented to person, place, and time. He appears well-developed and well-nourished. No distress.  HENT:  Head: Normocephalic and atraumatic.  Right Ear: External ear normal.  Left Ear: External ear normal.  Nose: Nose normal.  Mouth/Throat: Oropharynx is clear and moist.  Eyes: Conjunctivae and EOM are normal. Pupils are equal, round, and reactive to light. Right eye exhibits no discharge. Left eye exhibits no discharge. No scleral icterus.  Neck: Normal range of motion. Neck supple. No tracheal deviation  present. No thyromegaly present.  Cardiovascular: Normal rate, regular rhythm and normal heart sounds.  Exam reveals no gallop and no friction rub.   No murmur heard. Pulmonary/Chest: Effort normal and breath sounds normal. No accessory muscle usage. No tachypnea. No respiratory distress. He has no decreased breath sounds. He has no wheezes. He has no rhonchi. He has no rales. He exhibits no tenderness.    Musculoskeletal: Normal range of motion. He exhibits no edema.  Lymphadenopathy:    He has no cervical adenopathy.  Neurological: He is alert and oriented to person, place, and time. No cranial nerve deficit. Coordination normal.  Skin: Skin is warm and dry. No rash noted. He is not diaphoretic. No erythema. No pallor.  Psychiatric: He has a normal mood and affect. His behavior is normal. Judgment and thought content normal.          Assessment & Plan:  Over 25min of which >50% spent in face-to-face contact with patient discussing plan of care  Problem List Items Addressed This Visit      Unprioritized   Contusion of rib on left side - Primary    Mild contusion of left rib. Encouraged use of Tylenol prn for pain. Follow up if symptoms not improving. Encouraged falls prevention.          Return if symptoms worsen or fail to improve.

## 2015-03-22 NOTE — Progress Notes (Signed)
Pre visit review using our clinic review tool, if applicable. No additional management support is needed unless otherwise documented below in the visit note. 

## 2015-03-22 NOTE — Telephone Encounter (Signed)
[facility] [place] [facility] - [place] TELEPHONE ADVICE RECORD [facility] Patient Name: Lynnda ChildJOHNNIE Palmieri DOB: 08/31/1911 Initial Comment Caller States father fell last night. he is walking fine, but when he sits or coughs he holds his right side. Nurse Assessment Nurse: Lane HackerHarley, RN, Elvin SoWindy Date/Time (Eastern Time): 03/22/2015 8:59:52 AM Confirm and document reason for call. If symptomatic, describe symptoms. ---Caller states father fell last night, fell in the bathroom on the ledge of the bathroom shower. Sometimes he is weak in the knees, and usually does all his ADL's himself. He is walking fine, but when he sits or coughs he holds his right side of his mid back. No bruising or swelling. Has the patient traveled out of the country within the last 30 days? ---Not Applicable Does the patient require triage? ---Yes Related visit to physician within the last 2 weeks? ---No Does the PT have any chronic conditions? (i.e. diabetes, asthma, etc.) ---Yes List chronic conditions. ---HTN, Enlarged Prostate, fall a year ago Guidelines Guideline Title Affirmed Question Affirmed Notes Back Injury [1] High-risk adult (e.g., age > 2660, osteoporosis, chronic steroid use) AND [2] still hurts Final Disposition User See Physician within 24 Hours Shippensburg UniversityHarley, RN, [employer] ChemicalWindy Comments Caller also has a bump on his knee, and some mild pain there. Appt made 03/22/15 with Alvino ChapelEllen NP (no available appts with PCP). Referrals REFERRED TO PCP OFFICE Disagree/Comply: Comply

## 2015-03-23 ENCOUNTER — Emergency Department: Payer: Medicare Other

## 2015-03-23 ENCOUNTER — Telehealth: Payer: Self-pay | Admitting: Internal Medicine

## 2015-03-23 ENCOUNTER — Other Ambulatory Visit: Payer: Self-pay

## 2015-03-23 ENCOUNTER — Emergency Department
Admission: EM | Admit: 2015-03-23 | Discharge: 2015-03-23 | Disposition: A | Discharge status: Home / Self Care | Payer: Medicare Other | Attending: Emergency Medicine | Admitting: Emergency Medicine

## 2015-03-23 ENCOUNTER — Encounter: Payer: Self-pay | Admitting: Emergency Medicine

## 2015-03-23 DIAGNOSIS — R040 Epistaxis: Secondary | ICD-10-CM | POA: Diagnosis not present

## 2015-03-23 DIAGNOSIS — I129 Hypertensive chronic kidney disease with stage 1 through stage 4 chronic kidney disease, or unspecified chronic kidney disease: Secondary | ICD-10-CM | POA: Insufficient documentation

## 2015-03-23 DIAGNOSIS — S79912A Unspecified injury of left hip, initial encounter: Secondary | ICD-10-CM | POA: Diagnosis not present

## 2015-03-23 DIAGNOSIS — Y998 Other external cause status: Secondary | ICD-10-CM | POA: Insufficient documentation

## 2015-03-23 DIAGNOSIS — N189 Chronic kidney disease, unspecified: Secondary | ICD-10-CM | POA: Diagnosis not present

## 2015-03-23 DIAGNOSIS — W1839XA Other fall on same level, initial encounter: Secondary | ICD-10-CM | POA: Insufficient documentation

## 2015-03-23 DIAGNOSIS — T148XXA Other injury of unspecified body region, initial encounter: Secondary | ICD-10-CM

## 2015-03-23 DIAGNOSIS — S299XXA Unspecified injury of thorax, initial encounter: Secondary | ICD-10-CM | POA: Diagnosis present

## 2015-03-23 DIAGNOSIS — Z87891 Personal history of nicotine dependence: Secondary | ICD-10-CM | POA: Diagnosis not present

## 2015-03-23 DIAGNOSIS — Z79899 Other long term (current) drug therapy: Secondary | ICD-10-CM | POA: Diagnosis not present

## 2015-03-23 DIAGNOSIS — Y9389 Activity, other specified: Secondary | ICD-10-CM | POA: Diagnosis not present

## 2015-03-23 DIAGNOSIS — Z880 Allergy status to penicillin: Secondary | ICD-10-CM | POA: Insufficient documentation

## 2015-03-23 DIAGNOSIS — S20212A Contusion of left front wall of thorax, initial encounter: Secondary | ICD-10-CM | POA: Insufficient documentation

## 2015-03-23 DIAGNOSIS — Y9289 Other specified places as the place of occurrence of the external cause: Secondary | ICD-10-CM | POA: Insufficient documentation

## 2015-03-23 LAB — CBC WITH DIFFERENTIAL/PLATELET
BASOS PCT: 1 %
Basophils Absolute: 0 10*3/uL (ref 0–0.1)
Eosinophils Absolute: 0.1 10*3/uL (ref 0–0.7)
Eosinophils Relative: 2 %
HCT: 30.5 % — ABNORMAL LOW (ref 40.0–52.0)
Hemoglobin: 10.2 g/dL — ABNORMAL LOW (ref 13.0–18.0)
Lymphocytes Relative: 15 %
Lymphs Abs: 0.9 10*3/uL — ABNORMAL LOW (ref 1.0–3.6)
MCH: 29.5 pg (ref 26.0–34.0)
MCHC: 33.4 g/dL (ref 32.0–36.0)
MCV: 88.4 fL (ref 80.0–100.0)
MONO ABS: 0.5 10*3/uL (ref 0.2–1.0)
Monocytes Relative: 8 %
Neutro Abs: 4.7 10*3/uL (ref 1.4–6.5)
Neutrophils Relative %: 74 %
Platelets: 163 10*3/uL (ref 150–440)
RBC: 3.46 MIL/uL — AB (ref 4.40–5.90)
RDW: 16.2 % — AB (ref 11.5–14.5)
WBC: 6.3 10*3/uL (ref 3.8–10.6)

## 2015-03-23 LAB — BASIC METABOLIC PANEL
Anion gap: 7 (ref 5–15)
BUN: 33 mg/dL — ABNORMAL HIGH (ref 6–20)
CHLORIDE: 110 mmol/L (ref 101–111)
CO2: 21 mmol/L — ABNORMAL LOW (ref 22–32)
Calcium: 9.1 mg/dL (ref 8.9–10.3)
Creatinine, Ser: 2.03 mg/dL — ABNORMAL HIGH (ref 0.61–1.24)
GFR calc non Af Amer: 25 mL/min — ABNORMAL LOW (ref >60)
GFR, EST AFRICAN AMERICAN: 29 mL/min — AB (ref >60)
GLUCOSE: 91 mg/dL (ref 65–99)
Potassium: 4.7 mmol/L (ref 3.5–5.1)
Sodium: 138 mmol/L (ref 135–145)

## 2015-03-23 NOTE — Telephone Encounter (Signed)
Please see below note

## 2015-03-23 NOTE — Telephone Encounter (Signed)
He should be evaluated immediately. He will need a CXR. I would recommend ER.

## 2015-03-23 NOTE — Discharge Instructions (Signed)
Contusion °A contusion is a deep bruise. Contusions are the result of an injury that caused bleeding under the skin. The contusion may turn blue, purple, or yellow. Minor injuries will give you a painless contusion, but more severe contusions may stay painful and swollen for a few weeks.  °CAUSES  °A contusion is usually caused by a blow, trauma, or direct force to an area of the body. °SYMPTOMS  °· Swelling and redness of the injured area. °· Bruising of the injured area. °· Tenderness and soreness of the injured area. °· Pain. °DIAGNOSIS  °The diagnosis can be made by taking a history and physical exam. An X-ray, CT scan, or MRI may be needed to determine if there were any associated injuries, such as fractures. °TREATMENT  °Specific treatment will depend on what area of the body was injured. In general, the best treatment for a contusion is resting, icing, elevating, and applying cold compresses to the injured area. Over-the-counter medicines may also be recommended for pain control. Ask your caregiver what the best treatment is for your contusion. °HOME CARE INSTRUCTIONS  °· Put ice on the injured area. °¨ Put ice in a plastic bag. °¨ Place a towel between your skin and the bag. °¨ Leave the ice on for 15-20 minutes, 3-4 times a day, or as directed by your health care provider. °· Only take over-the-counter or prescription medicines for pain, discomfort, or fever as directed by your caregiver. Your caregiver may recommend avoiding anti-inflammatory medicines (aspirin, ibuprofen, and naproxen) for 48 hours because these medicines may increase bruising. °· Rest the injured area. °· If possible, elevate the injured area to reduce swelling. °SEEK IMMEDIATE MEDICAL CARE IF:  °· You have increased bruising or swelling. °· You have pain that is getting worse. °· Your swelling or pain is not relieved with medicines. °MAKE SURE YOU:  °· Understand these instructions. °· Will watch your condition. °· Will get help right  away if you are not doing well or get worse. °Document Released: 06/06/2005 Document Revised: 09/01/2013 Document Reviewed: 07/02/2011 °[employer]® Patient Information ©2015 [employer]. This information is not intended to replace advice given to you by your health care provider. Make sure you discuss any questions you have with your health care provider. ° °

## 2015-03-23 NOTE — ED Provider Notes (Signed)
[facility] Emergency Department Provider Note  ____________________________________________  Time seen: Approximately 1:20 PM  I have reviewed the triage vital signs and the nursing notes.   HISTORY  Chief Complaint Fall    HPI [name] is a 81 y.o. male with a history of chronic kidney disease and chronic lower extremity weakness who presents with left-sided chest pain and left hip pain after fall 2 days ago. The patient was walking to the bathroom when his knees gave out on him. This has happened several times in the past. He was seen and evaluated by his primary care doctor yesterday and discharged to home. He is now complaining today of spitting up blood several times over night and then this a.m. The daughter and the patient said that this has been reducing. Patient has been able to ambulate since the fall. He denies hitting his head or losing consciousness.   Past Medical History  Diagnosis Date  . Chronic kidney disease   . Syncope and collapse June 2012    pre syncopal  . Hypertension     Patient Active Problem List   Diagnosis Date Noted  . Contusion of rib on left side 03/22/2015  . Weakness 10/22/2014  . Heart murmur 06/21/2014  . Pulmonary hypertension 06/21/2014  . Anemia 07/30/2013  . Skin lesion of face 07/30/2013  . Gait instability 06/25/2013  . Medicare annual wellness visit, subsequent 01/30/2013  . Carotid stenosis 12/18/2012  . Right carotid bruit 06/27/2012  . Penile swelling 05/28/2012  . Edema 03/12/2011  . HTN (hypertension) 03/12/2011  . CRI (chronic renal insufficiency) 03/12/2011    Past Surgical History  Procedure Laterality Date  . Prostate surgery    . Intraocular lens implant, secondary  1990    bilateral implant  . Gallbladder surgery    . Hernia repair    . Kidney stone surgery      Current Outpatient Rx  Name  Route  Sig  Dispense  Refill  . ferrous fumarate (HEMOCYTE - 106 MG FE) 325 (106  FE) MG TABS tablet   Oral   Take 1 tablet by mouth daily.         . fluocinonide ointment (LIDEX) 0.05 %   Topical   Apply 1 application topically 2 (two) times daily.   60 g   1   . hydrALAZINE (APRESOLINE) 25 MG tablet      TAKE 1 TABLET FOUR TIMES A DAY   360 tablet   1   . OMEPRAZOLE PO   Oral   Take by mouth.         . Probiotic Product (PROBIOTIC DAILY PO)   Oral   Take by mouth.         . Red Yeast Rice Extract (RED YEAST RICE PO)   Oral   Take by mouth.         . furosemide (LASIX) 20 MG tablet   Oral   Take 20 mg by mouth daily as needed.         . Omega-3 Fatty Acids (FISH OIL) 1000 MG CAPS   Oral   Take by mouth.           Allergies Amoxicillin; Clarithromycin; Influenza vaccines; Levofloxacin; Macrodantin; Metronidazole; Micardis; Nabumetone; Neosporin; Penicillins; Sulfa drugs cross reactors; and Zithromax  Family History  Problem Relation Age of Onset  . Family history unknown: Yes    Social History History  Substance Use Topics  . Smoking status: Former Smoker --  1.00 packs/day for 20 years    Types: Cigarettes    Quit date: 10/01/1949  . Smokeless tobacco: Never Used  . Alcohol Use: No    Review of Systems Constitutional: No fever/chills Eyes: No visual changes. ENT: No sore throat. Cardiovascular: Denies chest pain. Respiratory: Denies shortness of breath. Gastrointestinal: No abdominal pain.  No nausea, no vomiting.  No diarrhea.  No constipation. Genitourinary: Negative for dysuria. Musculoskeletal: Negative for back pain. Skin: Negative for rash. Neurological: Negative for headaches, focal weakness or numbness.  10-point ROS otherwise negative.  ____________________________________________   PHYSICAL EXAM:  VITAL SIGNS: ED Triage Vitals  Enc Vitals Group     BP 03/23/15 1128 178/70 mmHg     Pulse Rate 03/23/15 1128 63     Resp 03/23/15 1128 16     Temp 03/23/15 1128 97.9 F (36.6 C)     Temp Source  03/23/15 1128 Oral     SpO2 03/23/15 1128 96 %     Weight 03/23/15 1128 150 lb (68.04 kg)     Height 03/23/15 1128 5\' 2"  (1.575 m)     Head Cir --      Peak Flow --      Pain Score 03/23/15 1223 3     Pain Loc --      Pain Edu? --      Excl. in GC? --     Constitutional: Alert and oriented. Well appearing and in no acute distress. Eyes: Conjunctivae are normal. PERRL. EOMI. Head: Atraumatic. Nose: No congestion/rhinnorhea. right ear with crusted blood. Anterior septum with bright red appearing area that is not actively bleeding but likely site of bleed. Mouth/Throat: Mucous membranes are moist.  Oropharynx non-erythematous. Neck: No stridor.   Cardiovascular: Normal rate, regular rhythm. Grossly normal heart sounds.  Good peripheral circulation. Respiratory: Normal respiratory effort.  No retractions. Lungs CTAB. Gastrointestinal: Soft and nontender. No distention. No abdominal bruits. No CVA tenderness. Musculoskeletal: No lower extremity tenderness .  No joint effusions. lower extremities with mild to moderate edema bilaterally which is the patient's baseline. Pelvis is stable. Able to range bilateral lower extremities passively without any resistance. No pain with range of motion. Left-sided chest tenderness over ribs 9 through 11. There is no bruising or crepitus over this area. Neurologic:  Normal speech and language. No gross focal neurologic deficits are appreciated. No gait instability. Skin:  Skin is warm, dry and intact. No rash noted. Psychiatric: Mood and affect are normal. Speech and behavior are normal.  ____________________________________________   LABS (all labs ordered are listed, but only abnormal results are displayed)  Labs Reviewed  CBC WITH DIFFERENTIAL/PLATELET - Abnormal; Notable for the following:    RBC 3.46 (*)    Hemoglobin 10.2 (*)    HCT 30.5 (*)    RDW 16.2 (*)    Lymphs Abs 0.9 (*)    All other components within normal limits  BASIC METABOLIC  PANEL - Abnormal; Notable for the following:    CO2 21 (*)    BUN 33 (*)    Creatinine, Ser 2.03 (*)    GFR calc non Af Amer 25 (*)    GFR calc Af Amer 29 (*)    All other components within normal limits   ____________________________________________  EKG  ED ECG REPORT I, Arelia Longest, the attending physician, personally viewed and interpreted this ECG.   Date: 03/23/2015  EKG Time: 1135  Rate: 59  Rhythm: sinus bradycardia  Axis: Normal axis  Intervals:LVH  with mild QRS widening.  ST&T Change: Concave ST elevation in V2 with peaked T wave in this lead.  Also with concave ST elevation in lead 1.  ____________________________________________  RADIOLOGY  chest x-ray without acute disease. Hip and pelvis x-rays without acute disease. Her sclerae reviewed the chest x-ray. ____________________________________________   PROCEDURES   ____________________________________________   INITIAL IMPRESSION / ASSESSMENT AND PLAN / ED COURSE  Pertinent labs & imaging results that were available during my care of the patient were reviewed by me and considered in my medical decision making (see chart for details). likely coughing up blood secondary to anterior epistaxis.  ----------------------------------------- 1:38 PM on 03/23/2015 -----------------------------------------  Patient likely with bruising and pain from his mechanical fall. Advise family to use Tylenol as well as icy hot at home for pain relief. Patient without any abdominal tenderness. Unclear etiology of the fluid levels in his colon. Does have some issues with constipation and has not had a bowel movement in 3 days. But no nausea and vomiting.  ----------------------------------------- 2:43 PM on 03/23/2015 -----------------------------------------  Labs resulted and at baseline. Patient is resting comfortably and has gotten himself dressed and would like to go home. We'll discharge to home. Advised the  patient to put Vaseline on his nasal septum bilaterally to help avoid further nosebleeds. Daughter was in the room and said that the patient keeps the air conditioning on high in his room and the aorta is very dry and there. Says will try using a humidifier as well. Does not appear any acute injury from the fall. Left thoracic pain however no ecchymosis and normal hemoglobin. I doubt there is a severe splenic injury. ____________________________________________   FINAL CLINICAL IMPRESSION(S) / ED DIAGNOSES  Acute contusion to the left chest. Acute anterior epistaxis. Initial visit.    Myrna Blazeravid Matthew Hiilei Gerst, MD 03/23/15 [id number]

## 2015-03-23 NOTE — Telephone Encounter (Signed)
Joann-daughter called to stated that druing the night pt coughed up bright red blood twice and again this morning.msn

## 2015-03-23 NOTE — ED Notes (Signed)
PT fell Monday PM, c/o lt hip and lt ribs  pain, seen PMD yesterday, Dx with bruising, no shortening or rotation noted, daughter states, possible spiting blood up this AM,

## 2015-03-23 NOTE — Telephone Encounter (Signed)
Notified pts daughter

## 2015-04-01 ENCOUNTER — Ambulatory Visit (INDEPENDENT_AMBULATORY_CARE_PROVIDER_SITE_OTHER): Payer: Medicare Other | Admitting: Internal Medicine

## 2015-04-01 ENCOUNTER — Encounter: Payer: Self-pay | Admitting: Internal Medicine

## 2015-04-01 VITALS — BP 179/54 | HR 60 | Temp 98.0°F | Ht 63.0 in | Wt 149.5 lb

## 2015-04-01 DIAGNOSIS — N489 Disorder of penis, unspecified: Secondary | ICD-10-CM | POA: Insufficient documentation

## 2015-04-01 DIAGNOSIS — R040 Epistaxis: Secondary | ICD-10-CM | POA: Insufficient documentation

## 2015-04-01 DIAGNOSIS — N4889 Other specified disorders of penis: Secondary | ICD-10-CM | POA: Diagnosis not present

## 2015-04-01 DIAGNOSIS — R2681 Unsteadiness on feet: Secondary | ICD-10-CM | POA: Diagnosis not present

## 2015-04-01 NOTE — Progress Notes (Signed)
Pre visit review using our clinic review tool, if applicable. No additional management support is needed unless otherwise documented below in the visit note. 

## 2015-04-01 NOTE — Progress Notes (Signed)
Subjective:    Patient ID: [name], male    DOB: 1911/02/03, 81 y.o.   MRN: [id number]  HPI  81YO male presents for follow up.  Fell on hard wood floor when walking to bathroom at night. Seen in ED.  No injury noted. Had some epistaxis at this visit, but none since discharge. Doing well at home. Eating well.  In the past, was seen by Dr. Achilles Dunk for a penile lesion. Used a topical cream and lesion resolved, however now it has recurred. He prefers to see a male physician for this.  Past medical, surgical, family and social history per today's encounter.  Review of Systems  Constitutional: Negative for fever, chills, activity change, appetite change, fatigue and unexpected weight change.  Eyes: Negative for visual disturbance.  Respiratory: Negative for cough and shortness of breath.   Cardiovascular: Negative for chest pain, palpitations and leg swelling.  Gastrointestinal: Negative for abdominal pain, diarrhea, constipation and abdominal distention.  Genitourinary: Positive for genital sores and penile pain. Negative for dysuria, urgency, penile swelling and difficulty urinating.  Musculoskeletal: Negative for arthralgias and gait problem.  Skin: Negative for color change and rash.  Hematological: Negative for adenopathy.  Psychiatric/Behavioral: Negative for sleep disturbance and dysphoric mood. The patient is not nervous/anxious.        Objective:    BP 179/54 mmHg  Pulse 60  Temp(Src) 98 F (36.7 C) (Oral)  Ht 5\' 3"  (1.6 m)  Wt 149 lb 8 oz (67.813 kg)  BMI 26.49 kg/m2  SpO2 98% Physical Exam  Constitutional: He is oriented to person, place, and time. He appears well-developed and well-nourished. No distress.  HENT:  Head: Normocephalic and atraumatic.  Right Ear: External ear normal.  Left Ear: External ear normal.  Nose: Nose normal.  Mouth/Throat: Oropharynx is clear and moist. No oropharyngeal exudate.  Eyes: Conjunctivae and EOM are normal. Pupils  are equal, round, and reactive to light. Right eye exhibits no discharge. Left eye exhibits no discharge. No scleral icterus.  Neck: Normal range of motion. Neck supple. No tracheal deviation present. No thyromegaly present.  Cardiovascular: Normal rate, regular rhythm and normal heart sounds.  Exam reveals no gallop and no friction rub.   No murmur heard. Pulmonary/Chest: Effort normal and breath sounds normal. No accessory muscle usage. No tachypnea. No respiratory distress. He has no decreased breath sounds. He has no wheezes. He has no rhonchi. He has no rales. He exhibits no tenderness.  Musculoskeletal: Normal range of motion. He exhibits no edema.  Lymphadenopathy:    He has no cervical adenopathy.  Neurological: He is alert and oriented to person, place, and time. No cranial nerve deficit. Coordination normal.  Skin: Skin is warm and dry. No rash noted. He is not diaphoretic. No erythema. No pallor.  Psychiatric: He has a normal mood and affect. His behavior is normal. Judgment and thought content normal.          Assessment & Plan:   Problem List Items Addressed This Visit      Unprioritized   Epistaxis    Epistaxis has improved. Discussed referral back to ENT if recurrence.      Gait instability - Primary    Gait instability chronic. Recent fall without injury. Pt has 24/7 care with his family. Encouraged falls prevention. Continue to monitor.      Penile lesion    Will set up followup referral to Dr. Achilles Dunk. Pt prefers that we not examine this today.  Relevant Orders   Ambulatory referral to Urology       Return in about 4 weeks (around 04/29/2015) for Recheck.

## 2015-04-01 NOTE — Assessment & Plan Note (Signed)
Will set up followup referral to Dr. Achilles Dunk. Pt prefers that we not examine this today.

## 2015-04-01 NOTE — Patient Instructions (Signed)
We will set up evaluation with Dr. Achilles Dunk.  Follow up in 4 weeks.

## 2015-04-01 NOTE — Assessment & Plan Note (Signed)
Gait instability chronic. Recent fall without injury. Pt has 24/7 care with his family. Encouraged falls prevention. Continue to monitor.

## 2015-04-01 NOTE — Assessment & Plan Note (Signed)
Epistaxis has improved. Discussed referral back to ENT if recurrence.

## 2015-04-12 ENCOUNTER — Ambulatory Visit (INDEPENDENT_AMBULATORY_CARE_PROVIDER_SITE_OTHER): Payer: Medicare Other | Admitting: Podiatry

## 2015-04-12 DIAGNOSIS — M79676 Pain in unspecified toe(s): Secondary | ICD-10-CM | POA: Diagnosis not present

## 2015-04-12 DIAGNOSIS — B351 Tinea unguium: Secondary | ICD-10-CM | POA: Diagnosis not present

## 2015-04-12 NOTE — Progress Notes (Signed)
Subjective: 81 y.o. returns the office today for painful, elongated, thickened toenails which he is unable to do himself. Denies any redness or drainage around the nails. Denies any acute changes since last appointment and no new complaints today. Denies any systemic complaints such as fevers, chills, nausea, vomiting.   Objective: AAO 3, NAD DP/PT pulses palpable, CRT less than 3 seconds Nails hypertrophic, dystrophic, elongated, brittle, discolored 10. There is tenderness overlying the nails 1-5 bilaterally. There is no surrounding erythema or drainage along the nail sites. No open lesions or pre-ulcerative lesions are identified. No other areas of tenderness bilateral lower extremities. No overlying edema, erythema, increased warmth. Mild HAV with the left >right.  No pain with calf compression, swelling, warmth, erythema.  Assessment: Patient presents with symptomatic onychomycosis  Plan: -Treatment options including alternatives, risks, complications were discussed -Nails sharply debrided 10 without complication/bleeding. -Discussed daily foot inspection. If there are any changes, to call the office immediately.  -Follow-up in 3 months or sooner if any problems are to arise. In the meantime, encouraged to call the office with any questions, concerns, changes symptoms.   Ovid Curd, DPM

## 2015-04-29 ENCOUNTER — Ambulatory Visit (INDEPENDENT_AMBULATORY_CARE_PROVIDER_SITE_OTHER): Payer: Medicare Other | Admitting: Internal Medicine

## 2015-04-29 ENCOUNTER — Encounter: Payer: Self-pay | Admitting: Internal Medicine

## 2015-04-29 VITALS — BP 157/63 | HR 67 | Temp 97.8°F | Ht 63.0 in | Wt 146.2 lb

## 2015-04-29 DIAGNOSIS — I159 Secondary hypertension, unspecified: Secondary | ICD-10-CM | POA: Diagnosis not present

## 2015-04-29 DIAGNOSIS — R2681 Unsteadiness on feet: Secondary | ICD-10-CM

## 2015-04-29 DIAGNOSIS — N183 Chronic kidney disease, stage 3 unspecified: Secondary | ICD-10-CM

## 2015-04-29 DIAGNOSIS — N4889 Other specified disorders of penis: Secondary | ICD-10-CM | POA: Diagnosis not present

## 2015-04-29 DIAGNOSIS — N489 Disorder of penis, unspecified: Secondary | ICD-10-CM

## 2015-04-29 NOTE — Assessment & Plan Note (Signed)
Chronic shuffling gait. Likely Parkinson's, however will hold off on adding any medications given his age. Continue 24/7 care with his family with falls prevention at home.

## 2015-04-29 NOTE — Patient Instructions (Signed)
Follow up in 3 months

## 2015-04-29 NOTE — Assessment & Plan Note (Signed)
Renal function stable in 03/2015. Will plan to repeat in 2 months.

## 2015-04-29 NOTE — Progress Notes (Signed)
Pre visit review using our clinic review tool, if applicable. No additional management support is needed unless otherwise documented below in the visit note. 

## 2015-04-29 NOTE — Assessment & Plan Note (Signed)
BP Readings from Last 3 Encounters:  04/29/15 157/63  04/01/15 179/54  03/23/15 168/82   BP well controlled for his age. Will continue current medications.

## 2015-04-29 NOTE — Assessment & Plan Note (Signed)
He reports lesion has improved. Declined for [place] to examine. Visit with urology pending.

## 2015-04-29 NOTE — Progress Notes (Signed)
Subjective:    Patient ID: [name], male    DOB: 07-01-1911, 81 y.o.   MRN: [id number]  HPI  81YO male presents for follow up.  No recent falls. Appetite good. Has not yet seen urology. Continues to have 24/7 care by his family.  Penile lesion - he reports this has decreased in size. Visit with urology pending.   Past Medical History  Diagnosis Date  . Chronic kidney disease   . Syncope and collapse June 2012    pre syncopal  . Hypertension    Family History  Problem Relation Age of Onset  . Family history unknown: Yes   Past Surgical History  Procedure Laterality Date  . Prostate surgery    . Intraocular lens implant, secondary  1990    bilateral implant  . Gallbladder surgery    . Hernia repair    . Kidney stone surgery     Social History   Social History  . Marital Status: Married    Spouse Name: N/A  . Number of Children: N/A  . Years of Education: N/A   Social History Main Topics  . Smoking status: Former Smoker -- 1.00 packs/day for 20 years    Types: Cigarettes    Quit date: 10/01/1949  . Smokeless tobacco: Never Used  . Alcohol Use: No  . Drug Use: No  . Sexual Activity: Not Asked   Other Topics Concern  . None   Social History Narrative    Review of Systems  Constitutional: Negative for fever, chills, activity change, appetite change, fatigue and unexpected weight change.  Eyes: Negative for visual disturbance.  Respiratory: Negative for cough and shortness of breath.   Cardiovascular: Negative for chest pain, palpitations and leg swelling.  Gastrointestinal: Negative for abdominal pain, diarrhea, constipation and abdominal distention.  Genitourinary: Negative for dysuria, urgency and difficulty urinating.  Musculoskeletal: Positive for gait problem. Negative for arthralgias.  Skin: Negative for color change and rash.  Hematological: Negative for adenopathy.  Psychiatric/Behavioral: Negative for sleep disturbance and dysphoric  mood. The patient is not nervous/anxious.        Objective:    BP 157/63 mmHg  Pulse 67  Temp(Src) 97.8 F (36.6 C) (Oral)  Ht 5\' 3"  (1.6 m)  Wt 146 lb 4 oz (66.339 kg)  BMI 25.91 kg/m2  SpO2 97% Physical Exam  Constitutional: He is oriented to person, place, and time. He appears well-developed and well-nourished. No distress.  HENT:  Head: Normocephalic and atraumatic.  Right Ear: External ear normal.  Left Ear: External ear normal.  Nose: Nose normal.  Mouth/Throat: Oropharynx is clear and moist. No oropharyngeal exudate.  Eyes: Conjunctivae and EOM are normal. Pupils are equal, round, and reactive to light. Right eye exhibits no discharge. Left eye exhibits no discharge. No scleral icterus.  Neck: Normal range of motion. Neck supple. No tracheal deviation present. No thyromegaly present.  Cardiovascular: Normal rate, regular rhythm and normal heart sounds.  Exam reveals no gallop and no friction rub.   No murmur heard. Pulmonary/Chest: Effort normal and breath sounds normal. No accessory muscle usage. No tachypnea. No respiratory distress. He has no decreased breath sounds. He has no wheezes. He has no rhonchi. He has no rales. He exhibits no tenderness.  Musculoskeletal: Normal range of motion. He exhibits no edema.  Lymphadenopathy:    He has no cervical adenopathy.  Neurological: He is alert and oriented to person, place, and time. No cranial nerve deficit. Coordination and gait (shuffling gait)  abnormal.  Skin: Skin is warm and dry. No rash noted. He is not diaphoretic. No erythema. No pallor.  Psychiatric: He has a normal mood and affect. His behavior is normal. Judgment and thought content normal.          Assessment & Plan:   Problem List Items Addressed This Visit      Unprioritized   CRI (chronic renal insufficiency)    Renal function stable in 03/2015. Will plan to repeat in 2 months.      Gait instability    Chronic shuffling gait. Likely Parkinson's,  however will hold off on adding any medications given his age. Continue 24/7 care with his family with falls prevention at home.      HTN (hypertension) - Primary    BP Readings from Last 3 Encounters:  04/29/15 157/63  04/01/15 179/54  03/23/15 168/82   BP well controlled for his age. Will continue current medications.      Penile lesion    He reports lesion has improved. Declined for [place] to examine. Visit with urology pending.          Return in about 3 months (around 07/30/2015) for Recheck.

## 2015-05-30 ENCOUNTER — Other Ambulatory Visit: Payer: Self-pay | Admitting: Internal Medicine

## 2015-06-29 ENCOUNTER — Telehealth: Payer: Self-pay | Admitting: Internal Medicine

## 2015-06-29 ENCOUNTER — Emergency Department: Payer: Medicare Other

## 2015-06-29 ENCOUNTER — Inpatient Hospital Stay
Admission: EM | Admit: 2015-06-29 | Discharge: 2015-07-02 | DRG: 194 | Disposition: A | Discharge status: Home Health | Payer: Medicare Other | Attending: Internal Medicine | Admitting: Internal Medicine

## 2015-06-29 DIAGNOSIS — J189 Pneumonia, unspecified organism: Secondary | ICD-10-CM | POA: Diagnosis present

## 2015-06-29 DIAGNOSIS — Z87442 Personal history of urinary calculi: Secondary | ICD-10-CM | POA: Diagnosis not present

## 2015-06-29 DIAGNOSIS — I248 Other forms of acute ischemic heart disease: Secondary | ICD-10-CM | POA: Diagnosis present

## 2015-06-29 DIAGNOSIS — I129 Hypertensive chronic kidney disease with stage 1 through stage 4 chronic kidney disease, or unspecified chronic kidney disease: Secondary | ICD-10-CM | POA: Diagnosis present

## 2015-06-29 DIAGNOSIS — Z87440 Personal history of urinary (tract) infections: Secondary | ICD-10-CM

## 2015-06-29 DIAGNOSIS — Z87891 Personal history of nicotine dependence: Secondary | ICD-10-CM

## 2015-06-29 DIAGNOSIS — Z8249 Family history of ischemic heart disease and other diseases of the circulatory system: Secondary | ICD-10-CM

## 2015-06-29 DIAGNOSIS — J690 Pneumonitis due to inhalation of food and vomit: Secondary | ICD-10-CM

## 2015-06-29 DIAGNOSIS — N184 Chronic kidney disease, stage 4 (severe): Secondary | ICD-10-CM | POA: Diagnosis present

## 2015-06-29 DIAGNOSIS — Z79899 Other long term (current) drug therapy: Secondary | ICD-10-CM | POA: Diagnosis not present

## 2015-06-29 DIAGNOSIS — H9190 Unspecified hearing loss, unspecified ear: Secondary | ICD-10-CM | POA: Diagnosis present

## 2015-06-29 DIAGNOSIS — D638 Anemia in other chronic diseases classified elsewhere: Secondary | ICD-10-CM | POA: Diagnosis present

## 2015-06-29 DIAGNOSIS — K219 Gastro-esophageal reflux disease without esophagitis: Secondary | ICD-10-CM | POA: Diagnosis present

## 2015-06-29 LAB — CBC WITH DIFFERENTIAL/PLATELET
Basophils Absolute: 0 10*3/uL (ref 0–0.1)
Basophils Relative: 0 %
Eosinophils Absolute: 0 10*3/uL (ref 0–0.7)
Eosinophils Relative: 0 %
HEMATOCRIT: 30.2 % — AB (ref 40.0–52.0)
Hemoglobin: 10 g/dL — ABNORMAL LOW (ref 13.0–18.0)
LYMPHS ABS: 0.7 10*3/uL — AB (ref 1.0–3.6)
LYMPHS PCT: 7 %
MCH: 30.4 pg (ref 26.0–34.0)
MCHC: 33.2 g/dL (ref 32.0–36.0)
MCV: 91.6 fL (ref 80.0–100.0)
Monocytes Absolute: 0.5 10*3/uL (ref 0.2–1.0)
Monocytes Relative: 5 %
Neutro Abs: 9 10*3/uL — ABNORMAL HIGH (ref 1.4–6.5)
Neutrophils Relative %: 88 %
PLATELETS: 133 10*3/uL — AB (ref 150–440)
RBC: 3.29 MIL/uL — AB (ref 4.40–5.90)
RDW: 15.3 % — ABNORMAL HIGH (ref 11.5–14.5)
WBC: 10.2 10*3/uL (ref 3.8–10.6)

## 2015-06-29 LAB — CBC
HEMATOCRIT: 28.1 % — AB (ref 40.0–52.0)
HEMOGLOBIN: 8.9 g/dL — AB (ref 13.0–18.0)
MCH: 29 pg (ref 26.0–34.0)
MCHC: 31.6 g/dL — ABNORMAL LOW (ref 32.0–36.0)
MCV: 91.9 fL (ref 80.0–100.0)
Platelets: 120 10*3/uL — ABNORMAL LOW (ref 150–440)
RBC: 3.06 MIL/uL — ABNORMAL LOW (ref 4.40–5.90)
RDW: 15.6 % — ABNORMAL HIGH (ref 11.5–14.5)
WBC: 9.9 10*3/uL (ref 3.8–10.6)

## 2015-06-29 LAB — COMPREHENSIVE METABOLIC PANEL
ALBUMIN: 3.3 g/dL — AB (ref 3.5–5.0)
ALT: 12 U/L — ABNORMAL LOW (ref 17–63)
AST: 19 U/L (ref 15–41)
Alkaline Phosphatase: 53 U/L (ref 38–126)
Anion gap: 5 (ref 5–15)
BUN: 41 mg/dL — ABNORMAL HIGH (ref 6–20)
CHLORIDE: 113 mmol/L — AB (ref 101–111)
CO2: 23 mmol/L (ref 22–32)
Calcium: 8.9 mg/dL (ref 8.9–10.3)
Creatinine, Ser: 2.21 mg/dL — ABNORMAL HIGH (ref 0.61–1.24)
GFR calc Af Amer: 26 mL/min — ABNORMAL LOW (ref >60)
GFR, EST NON AFRICAN AMERICAN: 22 mL/min — AB (ref >60)
GLUCOSE: 109 mg/dL — AB (ref 65–99)
POTASSIUM: 4 mmol/L (ref 3.5–5.1)
Sodium: 141 mmol/L (ref 135–145)
Total Bilirubin: 0.7 mg/dL (ref 0.3–1.2)
Total Protein: 5.6 g/dL — ABNORMAL LOW (ref 6.5–8.1)

## 2015-06-29 LAB — LACTIC ACID, PLASMA
LACTIC ACID, VENOUS: 1.3 mmol/L (ref 0.5–2.0)
LACTIC ACID, VENOUS: 1.2 mmol/L (ref 0.5–2.0)

## 2015-06-29 LAB — CREATININE, SERUM
Creatinine, Ser: 2.07 mg/dL — ABNORMAL HIGH (ref 0.61–1.24)
GFR, EST AFRICAN AMERICAN: 28 mL/min — AB (ref >60)
GFR, EST NON AFRICAN AMERICAN: 24 mL/min — AB (ref >60)

## 2015-06-29 LAB — TROPONIN I: Troponin I: 0.1 ng/mL — ABNORMAL HIGH (ref <0.031)

## 2015-06-29 MED ORDER — DEXTROSE 5 % IV SOLN
1.0000 g | Freq: Once | INTRAVENOUS | Status: AC
  Administered 2015-06-29: 1 g via INTRAVENOUS
  Filled 2015-06-29: qty 10

## 2015-06-29 MED ORDER — DOXYCYCLINE HYCLATE 100 MG PO TABS
100.0000 mg | ORAL_TABLET | Freq: Two times a day (BID) | ORAL | Status: DC
  Administered 2015-06-30 – 2015-07-02 (×5): 100 mg via ORAL
  Filled 2015-06-29 (×5): qty 1

## 2015-06-29 MED ORDER — OXYCODONE HCL 5 MG PO TABS
5.0000 mg | ORAL_TABLET | ORAL | Status: DC | PRN
  Administered 2015-07-01: 5 mg via ORAL
  Filled 2015-06-29: qty 1

## 2015-06-29 MED ORDER — PANTOPRAZOLE SODIUM 40 MG PO TBEC
40.0000 mg | DELAYED_RELEASE_TABLET | Freq: Every day | ORAL | Status: DC
  Administered 2015-06-30 – 2015-07-02 (×3): 40 mg via ORAL
  Filled 2015-06-29 (×4): qty 1

## 2015-06-29 MED ORDER — HYDRALAZINE HCL 25 MG PO TABS
25.0000 mg | ORAL_TABLET | Freq: Four times a day (QID) | ORAL | Status: DC
  Administered 2015-06-30: 25 mg via ORAL
  Filled 2015-06-29: qty 1

## 2015-06-29 MED ORDER — ONDANSETRON HCL 4 MG/2ML IJ SOLN
4.0000 mg | Freq: Four times a day (QID) | INTRAMUSCULAR | Status: DC | PRN
Start: 2015-06-29 — End: 2015-07-02

## 2015-06-29 MED ORDER — FERROUS SULFATE 325 (65 FE) MG PO TABS
325.0000 mg | ORAL_TABLET | Freq: Every day | ORAL | Status: DC
  Administered 2015-06-30 – 2015-07-02 (×3): 325 mg via ORAL
  Filled 2015-06-29 (×3): qty 1

## 2015-06-29 MED ORDER — ONDANSETRON HCL 4 MG PO TABS: 4.0000 mg | ORAL_TABLET | Freq: Four times a day (QID) | ORAL | Status: DC | PRN

## 2015-06-29 MED ORDER — HEPARIN SODIUM (PORCINE) 5000 UNIT/ML IJ SOLN
5000.0000 [IU] | Freq: Three times a day (TID) | INTRAMUSCULAR | Status: DC
  Administered 2015-06-29 – 2015-07-02 (×8): 5000 [IU] via SUBCUTANEOUS
  Filled 2015-06-29 (×7): qty 1

## 2015-06-29 MED ORDER — SODIUM CHLORIDE 0.9 % IV BOLUS (SEPSIS)
500.0000 mL | Freq: Once | INTRAVENOUS | Status: AC
  Administered 2015-06-29: 500 mL via INTRAVENOUS

## 2015-06-29 MED ORDER — ACETAMINOPHEN 325 MG PO TABS: 650.0000 mg | ORAL_TABLET | Freq: Four times a day (QID) | ORAL | Status: DC | PRN

## 2015-06-29 MED ORDER — DEXTROSE 5 % IV SOLN
1.0000 g | INTRAVENOUS | Status: DC
  Administered 2015-06-30 – 2015-07-01 (×2): 1 g via INTRAVENOUS
  Filled 2015-06-29 (×5): qty 10

## 2015-06-29 MED ORDER — DOXYCYCLINE HYCLATE 100 MG PO TABS
100.0000 mg | ORAL_TABLET | Freq: Once | ORAL | Status: AC
  Administered 2015-06-29: 100 mg via ORAL
  Filled 2015-06-29: qty 1

## 2015-06-29 MED ORDER — RISAQUAD PO CAPS
1.0000 | ORAL_CAPSULE | Freq: Every day | ORAL | Status: DC
  Administered 2015-06-30 – 2015-07-02 (×3): 1 via ORAL
  Filled 2015-06-29 (×3): qty 1

## 2015-06-29 MED ORDER — IPRATROPIUM-ALBUTEROL 0.5-2.5 (3) MG/3ML IN SOLN: 3.0000 mL | RESPIRATORY_TRACT | Status: DC | PRN

## 2015-06-29 MED ORDER — SODIUM CHLORIDE 0.9 % IJ SOLN
3.0000 mL | Freq: Two times a day (BID) | INTRAMUSCULAR | Status: DC
  Administered 2015-06-30 – 2015-07-02 (×5): 3 mL via INTRAVENOUS

## 2015-06-29 MED ORDER — MORPHINE SULFATE (PF) 2 MG/ML IV SOLN: 2.0000 mg | INTRAVENOUS | Status: DC | PRN

## 2015-06-29 MED ORDER — GLUCOSAMINE-CHONDROITIN 500-400 MG PO TABS: 1.0000 | ORAL_TABLET | Freq: Three times a day (TID) | ORAL | Status: DC

## 2015-06-29 MED ORDER — ACETAMINOPHEN 650 MG RE SUPP: 650.0000 mg | Freq: Four times a day (QID) | RECTAL | Status: DC | PRN

## 2015-06-29 MED ORDER — OCUVITE-LUTEIN PO CAPS
1.0000 | ORAL_CAPSULE | Freq: Every day | ORAL | Status: DC
  Administered 2015-06-30 – 2015-07-02 (×3): 1 via ORAL
  Filled 2015-06-29 (×3): qty 1

## 2015-06-29 NOTE — Telephone Encounter (Signed)
Advised daughter and she agreed with ER.

## 2015-06-29 NOTE — Telephone Encounter (Signed)
Please advise 

## 2015-06-29 NOTE — Telephone Encounter (Signed)
Pt daughter called about pt health this morning he could not get up. Pt bp 104/37, no fever, heart rate was 66. Daughter would like know what to do? Pt was spiting up blood but she says he did do that before the Dr Dan HumphreysWalker stated that he could ruptured a blood vessel.  Thank You!

## 2015-06-29 NOTE — Telephone Encounter (Signed)
He could not get up because he was weak? This would be very unusual for him. I would recommend evaluation at the ER with labs, and possible CXR if he is weak and/or "spitting up blood"

## 2015-06-29 NOTE — ED Notes (Signed)
Pt presents from home with generalized weakness and fever.

## 2015-06-29 NOTE — H&P (Signed)
[facility] - [place] at [facility]   PATIENT NAME: [name]    MR#:  [id number]  DATE OF BIRTH:  02-Apr-1911   DATE OF ADMISSION:  06/29/2015  PRIMARY CARE PHYSICIAN: Wynona Dove, MD   REQUESTING/REFERRING PHYSICIAN: McShane  CHIEF COMPLAINT:   Chief Complaint  Patient presents with  . Weakness  . Fever    HISTORY OF PRESENT ILLNESS:  [name]  is a 81 y.o. male with a known history of essential hypertension who is presenting with weakness. Patient is extremely hard of hearing history obtained from family members present at bedside. They described one day duration of weakness without further localizing factors. Upon further questioning he states that he does have a cough which is chronic, unchanged denies any known fevers or chills came to Hospital further workup and evaluation. Upon arrival to the hospital noted be febrile temperature 100.7 with increased respiratory rate.  PAST MEDICAL HISTORY:   Past Medical History  Diagnosis Date  . Chronic kidney disease   . Syncope and collapse June 2012    pre syncopal  . Hypertension     PAST SURGICAL HISTORY:   Past Surgical History  Procedure Laterality Date  . Prostate surgery    . Intraocular lens implant, secondary  1990    bilateral implant  . Gallbladder surgery    . Hernia repair    . Kidney stone surgery      SOCIAL HISTORY:   Social History  Substance Use Topics  . Smoking status: Former Smoker -- 1.00 packs/day for 20 years    Types: Cigarettes    Quit date: 10/01/1949  . Smokeless tobacco: Never Used  . Alcohol Use: No    FAMILY HISTORY:   Family History  Problem Relation Age of Onset  . Hypertension Other   . Diabetes Neg Hx     DRUG ALLERGIES:   Allergies  Allergen Reactions  . Amoxicillin Other (See Comments)    Reaction:  Unknown   . Clarithromycin Other (See Comments)    Reaction:  Unknown   . Influenza Vaccines Other (See Comments)   Reaction:  Unknown   . Levofloxacin Other (See Comments)    Reaction:  Unknown   . Macrodantin Other (See Comments)    Reaction:  Unknown   . Metronidazole Other (See Comments)    Reaction:  Unknown   . Micardis [Telmisartan] Other (See Comments)    Reaction:  Unknown   . Nabumetone Other (See Comments)    Reaction:  Unknown   . Neosporin [Neomycin-Polymyxin-Gramicidin] Other (See Comments)    Reaction:  Unknown   . Penicillins Other (See Comments)    Reaction:  Unknown   . Sulfa Drugs Cross Reactors Other (See Comments)    Reaction:  Unknown   . Zithromax [Azithromycin] Diarrhea and Other (See Comments)    Reaction:  Dizziness and headaches    REVIEW OF SYSTEMS:  REVIEW OF SYSTEMS:  CONSTITUTIONAL: Denies fevers, chills, fatigue, weakness.  EYES: Denies blurred vision, double vision, or eye pain.  EARS, NOSE, THROAT: Denies tinnitus, ear pain, hard of hearing positive RESPIRATORY: Positive cough, denies shortness of breath, wheezing  CARDIOVASCULAR: Denies chest pain, palpitations, edema.  GASTROINTESTINAL: Denies nausea, vomiting, diarrhea, abdominal pain.  GENITOURINARY: Denies dysuria, hematuria.  ENDOCRINE: Denies nocturia or thyroid problems. HEMATOLOGIC AND LYMPHATIC: Denies easy bruising or bleeding.  SKIN: Denies rash or lesions.  MUSCULOSKELETAL: Denies pain in neck, back, shoulder, knees, hips, or further arthritic symptoms.  NEUROLOGIC: Denies  paralysis, paresthesias.  PSYCHIATRIC: Denies anxiety or depressive symptoms. Otherwise full review of systems performed by me is negative.   MEDICATIONS AT HOME:   Prior to Admission medications   Medication Sig Start Date End Date Taking? Authorizing Provider  acidophilus (RISAQUAD) CAPS capsule Take 1 capsule by mouth daily.   Yes Historical Provider, MD  ferrous sulfate 325 (65 FE) MG EC tablet Take 325 mg by mouth daily.   Yes Historical Provider, MD  glucosamine-chondroitin 500-400 MG tablet Take 1 tablet by mouth  3 (three) times daily.   Yes Historical Provider, MD  hydrALAZINE (APRESOLINE) 25 MG tablet Take 25 mg by mouth 4 (four) times daily.   Yes Historical Provider, MD  multivitamin-lutein (OCUVITE-LUTEIN) CAPS capsule Take 1 capsule by mouth daily.   Yes Historical Provider, MD  nystatin-triamcinolone ointment (MYCOLOG) Apply 1 application topically 2 (two) times daily as needed (for bed sores/ulcers).   Yes Historical Provider, MD  omeprazole (PRILOSEC) 20 MG capsule Take 20 mg by mouth daily.   Yes Historical Provider, MD      VITAL SIGNS:  Blood pressure 134/54, pulse 61, temperature 100.7 F (38.2 C), temperature source Rectal, resp. rate 20, height 5\' 1"  (1.549 m), weight 147 lb (66.679 kg), SpO2 100 %.  PHYSICAL EXAMINATION:  VITAL SIGNS: Filed Vitals:   06/29/15 2100  BP: 134/54  Pulse: 61  Temp:   Resp: 20   GENERAL:81 y.o.male currently in no acute distress. Chronically ill/frail appearing  HEAD: Normocephalic, atraumatic.  EYES: Pupils equal, round, reactive to light. Extraocular muscles intact. No scleral icterus.  MOUTH: Moist mucosal membrane. Dentition intact. No abscess noted.  EAR, NOSE, THROAT: Clear without exudates. No external lesions.  NECK: Supple. No thyromegaly. No nodules. No JVD.  PULMONARY: Coarse breath sounds with scattered rhonchi most prominent on the left, No use of accessory muscles, Good respiratory effort. good air entry bilaterally CHEST: Nontender to palpation.  CARDIOVASCULAR: S1 and S2. Regular rate and rhythm. No murmurs, rubs, or gallops. No edema. Pedal pulses 2+ bilaterally.  GASTROINTESTINAL: Soft, nontender, nondistended. No masses. Positive bowel sounds. No hepatosplenomegaly.  MUSCULOSKELETAL: No swelling, clubbing, or edema. Range of motion full in all extremities.  NEUROLOGIC: Cranial nerves II through XII are intact. No gross focal neurological deficits. Sensation intact. Reflexes intact.  SKIN: No ulceration, lesions, rashes, or  cyanosis. Skin warm and dry. Turgor intact.  PSYCHIATRIC: Mood, affect within normal limits. The patient is awake, alert and oriented x 3. Insight, judgment intact.    LABORATORY PANEL:   CBC  Recent Labs Lab 06/29/15 1847  WBC 10.2  HGB 10.0*  HCT 30.2*  PLT 133*   ------------------------------------------------------------------------------------------------------------------  Chemistries   Recent Labs Lab 06/29/15 1847  NA 141  K 4.0  CL 113*  CO2 23  GLUCOSE 109*  BUN 41*  CREATININE 2.21*  CALCIUM 8.9  AST 19  ALT 12*  ALKPHOS 53  BILITOT 0.7   ------------------------------------------------------------------------------------------------------------------  Cardiac Enzymes  Recent Labs Lab 06/29/15 1847  TROPONINI 0.10*   ------------------------------------------------------------------------------------------------------------------  RADIOLOGY:  Dg Chest Port 1 View  06/29/2015  CLINICAL DATA:  Generalized weakness and fever since this morning. Hemoptysis. EXAM: PORTABLE CHEST 1 VIEW COMPARISON:  03/23/2015. FINDINGS: Trachea is midline. Heart size stable. Moderate hiatal hernia. Patchy airspace consolidation in the left perihilar region. Lungs are low in volume. IMPRESSION: Patchy consolidation in the left perihilar region may be due to pneumonia or pulmonary hemorrhage. Followup PA and lateral chest X-ray is recommended in 3-4 weeks following trial  of antibiotic therapy to ensure resolution and exclude underlying malignancy. Electronically Signed   By: Leanna Battles M.D.   On: 06/29/2015 19:14    EKG:   Orders placed or performed during the hospital encounter of 06/29/15  . ED EKG  . ED EKG  . EKG 12-Lead  . EKG 12-Lead    IMPRESSION AND PLAN:   81 year old Caucasian gentleman history of essential hypertension presenting one-day duration weakness found to have pneumonia  1. Community-acquired pneumonia: Cultures obtained in emergency  department, antibiotic choice ceftriaxone/doxycycline listed allergies, DuoNeb treatments as required, supplemental oxygen to keep SaO2 greater than 92% 2. Elevated troponin: In setting of chronic kidney disease likely noncardiac however place on telemetry trend cardiac enzymes 3 3. Essential hypertension: Hydralazine 4. GERD without cellulitis: PPI therapy 5. Venous thromboembolism prophylactic: Heparin subcutaneous    All the records are reviewed and case discussed with ED provider. Management plans discussed with the patient, family and they are in agreement.  CODE STATUS: DO NOT RESUSCITATE  TOTAL TIME TAKING CARE OF THIS PATIENT: 45 minutes.    Hower,  Mardi Mainland.D on 06/29/2015 at 9:34 PM  Between 7am to 6pm - Pager - [phone]  After 6pm: House Pager: [phone]  [facility]  Office  [phone]  CC: Primary care physician; Wynona Dove, MD

## 2015-06-29 NOTE — Progress Notes (Signed)
Pt having coughing episodes after drinking water.  Pt does not have a history of stroke, but daughter verbalized pt has history of bell's palsy.  Performed bed side swallow eval and keeping pt NPO at this time until speech eval done.  Cristela FeltHelen Iris Guidry, RN

## 2015-06-29 NOTE — Progress Notes (Signed)
PHARMACIST - PHYSICIAN ORDER COMMUNICATION  CONCERNING: P&T Medication Policy on Herbal Medications  DESCRIPTION:  This patient's order for:  Glucosamine-chondroitin  has been noted.  This product(s) is classified as an "herbal" or natural product. Due to a lack of definitive safety studies or FDA approval, nonstandard manufacturing practices, plus the potential risk of unknown drug-drug interactions while on inpatient medications, the Pharmacy and Therapeutics Committee does not permit the use of "herbal" or natural products of this type within [place].   ACTION TAKEN: The pharmacy department is unable to verify this order at this time  Please reevaluate patient's clinical condition at discharge and address if the herbal or natural product(s) should be resumed at that time.    

## 2015-06-29 NOTE — ED Provider Notes (Addendum)
[facility] Emergency Department Provider Note  ____________________________________________   I have reviewed the triage vital signs and the nursing notes.   HISTORY  Chief Complaint Weakness and Fever    HPI [name] is a 81 y.o. male presents today with fever and feeling weak. Family states he's been coughing for a few days. They deny hemoptysis. He has also a history of urinary tract infections. He himself states he feels "pretty good". He is DO NOT RESUSCITATE. Patient cannot provide much more history. He normally ambulates at home according to family but is unable to do so today because he feels weak. Denies chest pain.  Past Medical History  Diagnosis Date  . Chronic kidney disease   . Syncope and collapse June 2012    pre syncopal  . Hypertension     Patient Active Problem List   Diagnosis Date Noted  . Penile lesion 04/01/2015  . Weakness 10/22/2014  . Heart murmur 06/21/2014  . Pulmonary hypertension (HCC) 06/21/2014  . Anemia 07/30/2013  . Skin lesion of face 07/30/2013  . Gait instability 06/25/2013  . Medicare annual wellness visit, subsequent 01/30/2013  . Carotid stenosis 12/18/2012  . Right carotid bruit 06/27/2012  . Penile swelling 05/28/2012  . Edema 03/12/2011  . HTN (hypertension) 03/12/2011  . CRI (chronic renal insufficiency) 03/12/2011    Past Surgical History  Procedure Laterality Date  . Prostate surgery    . Intraocular lens implant, secondary  1990    bilateral implant  . Gallbladder surgery    . Hernia repair    . Kidney stone surgery      Current Outpatient Rx  Name  Route  Sig  Dispense  Refill  . ferrous fumarate (HEMOCYTE - 106 MG FE) 325 (106 FE) MG TABS tablet   Oral   Take 1 tablet by mouth daily.         . fluocinonide ointment (LIDEX) 0.05 %   Topical   Apply 1 application topically 2 (two) times daily.   60 g   1   . furosemide (LASIX) 20 MG tablet   Oral   Take 20 mg by  mouth daily as needed.         . hydrALAZINE (APRESOLINE) 25 MG tablet      TAKE 1 TABLET FOUR TIMES A DAY   360 tablet   0   . Omega-3 Fatty Acids (FISH OIL) 1000 MG CAPS   Oral   Take by mouth.         . OMEPRAZOLE PO   Oral   Take by mouth.         . Probiotic Product (PROBIOTIC DAILY PO)   Oral   Take by mouth.         . Red Yeast Rice Extract (RED YEAST RICE PO)   Oral   Take by mouth.           Allergies Amoxicillin; Clarithromycin; Influenza vaccines; Levofloxacin; Macrodantin; Metronidazole; Micardis; Nabumetone; Neosporin; Penicillins; Sulfa drugs cross reactors; and Zithromax  Family History  Problem Relation Age of Onset  . Family history unknown: Yes    Social History Social History  Substance Use Topics  . Smoking status: Former Smoker -- 1.00 packs/day for 20 years    Types: Cigarettes    Quit date: 10/01/1949  . Smokeless tobacco: Never Used  . Alcohol Use: No    Review of Systems Constitutionalpositive for fever No visual changes. ENT: No sore throat. No stiff neck  no neck pain Cardiovascular: Denies chest pain. Respiratory: Denies shortness of breath. positive for cough Gastrointestinal:   no vomiting.  No diarrhea.  No constipation. Genitourinary: Negative for dysuria. Musculoskeletal: Negative lower extremity swelling Skin: Negative for rash. Neurological: Negative for headaches, focal weakness or numbness. 10-point ROS otherwise negative.  ____________________________________________   PHYSICAL EXAM:  VITAL SIGNS: ED Triage Vitals  Enc Vitals Group     BP 06/29/15 1838 135/55 mmHg     Pulse Rate 06/29/15 1838 65     Resp 06/29/15 1838 25     Temp 06/29/15 1838 100.7 F (38.2 C)     Temp Source 06/29/15 1838 Rectal     SpO2 06/29/15 1838 95 %     Weight 06/29/15 1838 147 lb (66.679 kg)     Height 06/29/15 1838  (1.549 m)     Head Cir --      Peak Flow --      Pain Score 06/29/15 1839 0     Pain Loc --       Pain Edu? --      Excl. in GC? --     Constitutional: Alert and orientedto name and place . Well appearing for ageand in no acute distress. Eyes: Conjunctivae are normal. PERRL. EOMI. Head: Atraumatic. Nose: No congestion/rhinnorhea. Mouth/Throat: Mucous membranes are moist.  Oropharynx non-erythematous. Neck: No stridor.   Nontender with no meningismus Cardiovascular: Normal rate, regular rhythm. Grossly normal heart sounds.  Good peripheral circulation. RespiratorOccasional rhonchi noted no wheeze no Rales Gastrointestinal: Soft and nontender. No distention. No guarding no rebound Back:  There is no focal tenderness or step off there is no midline tenderness there are no lesions noted. there is no CVA tenderness GU: Normal shoulder genitalia  Musculoskeletal: No lower extremity tenderness. No joint effusions, no DVT signs strong distal pulses no edema Neurologic:  Normal speech and language. No gross focal neurologic deficits are appreciated.  Skin:  Skin is warm, dry and intact. No rash noted. Psychiatric: Mood and affect are normal. Speech and behavior are normal.  ____________________________________________   LABS (all labs ordered are listed, but only abnormal results are displayed)  Labs Reviewed  COMPREHENSIVE METABOLIC PANEL - Abnormal; Notable for the following:    Chloride 113 (*)    Glucose, Bld 109 (*)    BUN 41 (*)    Creatinine, Ser 2.21 (*)    Total Protein 5.6 (*)    Albumin 3.3 (*)    ALT 12 (*)    GFR calc non Af Amer 22 (*)    GFR calc Af Amer 26 (*)    All other components within normal limits  CBC WITH DIFFERENTIAL/PLATELET - Abnormal; Notable for the following:    RBC 3.29 (*)    Hemoglobin 10.0 (*)    HCT 30.2 (*)    RDW 15.3 (*)    Platelets 133 (*)    Neutro Abs 9.0 (*)    Lymphs Abs 0.7 (*)    All other components within normal limits  TROPONIN I - Abnormal; Notable for the following:    Troponin I 0.10 (*)    All other components within  normal limits  CULTURE, BLOOD (ROUTINE X 2)  CULTURE, BLOOD (ROUTINE X 2)  URINE CULTURE  LACTIC ACID, PLASMA  LACTIC ACID, PLASMA  URINALYSIS COMPLETEWITH MICROSCOPIC ([facility] ONLY)   ____________________________________________  EKG Sinus rhythm and LVH with borderline bundle branch block, report abnormality noted, I have personally interpreted EKG___________________________________________  RADIOLOGY  I  REVIEWED X-RAY FINDINGS____________________________________________   PROCEDURES  Procedure(s) performed: None  Critical Care performed: None  ____________________________________________   INITIAL IMPRESSION / ASSESSMENT AND PLAN / ED COURSE  Pertinent labs & imaging results that were available during my care of the patient were reviewed by me and considered in my medical decision making (see chart for [id number] year old gentleman presents today with some cough over the last few days and fever. Chest x-ray does show an infiltrate. He is not allergic to Rocephin and has had it before, we are giving him Rocephin and doxycycline which is when he got last time he had a two-minute he acquired pneumonia. Concern also exists for possible UTI he has yet to give us a urine sample but I do not wish to delay anabiosis which has been ordered. Patient does not appear septic. Borderline troponin noted,. No chest pain, EKG unchanged from baseline, and patient is febrile. Do not think he is a candidate for emergent cardiac catheterization even if this were cardiogenic    FINAL CLINICAL IMPRESSION(S) / ED DIAGNOSES  Final diagnoses:  Aspiration pneumonia, unspecified aspiration pneumonia type, unspecified laterality, unspecified part of lung (HCC)     Jeanmarie PlantJames A McShane, MD 06/29/15 2042  Jeanmarie PlantJames A McShane, MD 06/29/15 2043

## 2015-06-30 LAB — URINALYSIS COMPLETE WITH MICROSCOPIC (ARMC ONLY)
Bilirubin Urine: NEGATIVE
GLUCOSE, UA: NEGATIVE mg/dL
HGB URINE DIPSTICK: NEGATIVE
Ketones, ur: NEGATIVE mg/dL
Leukocytes, UA: NEGATIVE
Nitrite: NEGATIVE
PH: 5 (ref 5.0–8.0)
Protein, ur: NEGATIVE mg/dL
Specific Gravity, Urine: 1.015 (ref 1.005–1.030)

## 2015-06-30 LAB — BASIC METABOLIC PANEL
ANION GAP: 5 (ref 5–15)
BUN: 40 mg/dL — ABNORMAL HIGH (ref 6–20)
CHLORIDE: 115 mmol/L — AB (ref 101–111)
CO2: 21 mmol/L — AB (ref 22–32)
Calcium: 8.5 mg/dL — ABNORMAL LOW (ref 8.9–10.3)
Creatinine, Ser: 2.17 mg/dL — ABNORMAL HIGH (ref 0.61–1.24)
GFR calc Af Amer: 27 mL/min — ABNORMAL LOW (ref >60)
GFR, EST NON AFRICAN AMERICAN: 23 mL/min — AB (ref >60)
GLUCOSE: 94 mg/dL (ref 65–99)
POTASSIUM: 3.6 mmol/L (ref 3.5–5.1)
Sodium: 141 mmol/L (ref 135–145)

## 2015-06-30 LAB — CBC
HEMATOCRIT: 28 % — AB (ref 40.0–52.0)
HEMOGLOBIN: 9 g/dL — AB (ref 13.0–18.0)
MCH: 29.2 pg (ref 26.0–34.0)
MCHC: 32 g/dL (ref 32.0–36.0)
MCV: 91.3 fL (ref 80.0–100.0)
Platelets: 128 10*3/uL — ABNORMAL LOW (ref 150–440)
RBC: 3.07 MIL/uL — ABNORMAL LOW (ref 4.40–5.90)
RDW: 15.5 % — ABNORMAL HIGH (ref 11.5–14.5)
WBC: 9.2 10*3/uL (ref 3.8–10.6)

## 2015-06-30 LAB — TROPONIN I
Troponin I: 0.07 ng/mL — ABNORMAL HIGH (ref <0.031)
Troponin I: 0.07 ng/mL — ABNORMAL HIGH (ref <0.031)
Troponin I: 0.08 ng/mL — ABNORMAL HIGH (ref <0.031)

## 2015-06-30 MED ORDER — HYDRALAZINE HCL 25 MG PO TABS
25.0000 mg | ORAL_TABLET | Freq: Three times a day (TID) | ORAL | Status: DC
  Administered 2015-06-30 – 2015-07-02 (×7): 25 mg via ORAL
  Filled 2015-06-30 (×8): qty 1

## 2015-06-30 NOTE — Evaluation (Signed)
Clinical/Bedside Swallow Evaluation Patient Details  Name: [name] MRN: [id number] Date of Birth: March 09, 1911  Today's Date: 06/30/2015 Time: SLP Start Time (ACUTE ONLY): 1030 SLP Stop Time (ACUTE ONLY): 1130 SLP Time Calculation (min) (ACUTE ONLY): 60 min  Past Medical History:  Past Medical History  Diagnosis Date  . Chronic kidney disease   . Syncope and collapse June 2012    pre syncopal  . Hypertension    Past Surgical History:  Past Surgical History  Procedure Laterality Date  . Prostate surgery    . Intraocular lens implant, secondary  1990    bilateral implant  . Gallbladder surgery    . Hernia repair    . Kidney stone surgery     HPI:  Pt is a 81 y.o. male with a known history of chronic kidney dis., and essential hypertension who is presenting with weakness. Family deny any dysphagia w/ po's at home. Patient is extremely hard of hearing history obtained from family members present at bedside. They described one day duration of weakness without further localizing factors. Upon further questioning he states that he does have a cough which is chronic, unchanged denies any known fevers or chills came to Hospital further workup and evaluation. Upon arrival to the hospital noted be febrile temperature 100.7 with increased respiratory rate.   Assessment / Plan / Recommendation Clinical Impression  Pt appears to present w/ functional oropharyngeal swallow abilites w/ no immediate, overt s/s of aspiration noted during po trials. Pt was noted to have a mild cough w/ slightly wet vocal quality as he tried to talk immediately following his drinking of the final amount of liquid in the cup while tilting his head back to finish. Suspect this head back positioning could be impacting pt's oropharyngeal control of liquid bolus material as it moves quickly into the pharynx. When pt was drinking single sips w/ head forward in the beginning of the trials, no overt s/s of aspiration was  noted. No oral phase defiicits were noted w/ trials. Pt is able to feed self w/ min. setup assist. Rec. a mech soft/regular diet w/ thin liquids w/ strict aspiration precautions and avoiding drinking w/ a head back position. Discussed w/ family and modeled appropriate drinking behavior/cup position; family w/ monitor pt and give verbal cues when [place]. as they stated "he'll need reminding". Rec. meds in puree for safer swallowing if any coughing noted when taking w/ liquids; aspiration precautions. ST will f/u as nec.     Aspiration Risk   (reduced if following aspiration precautions)    Diet Recommendation Age appropriate regular solids;Thin   Medication Administration:  (but in Puree if nec. for safer swallowing) Compensations: Minimize environmental distractions;Slow rate;Small sips/bites (head forward positioning)    Other  Recommendations Oral Care Recommendations: Oral care BID;Patient independent with oral care   Follow Up Recommendations       Frequency and Duration min 2x/week  1 week   Pertinent Vitals/Pain denied    SLP Swallow Goals  see care plan   Swallow Study Prior Functional Status   pt lives at home w/ family assisting w/ needs. Pt eats a regular diet per family report w/ no decline in pulmonary status recently; chronic cough baseline per family report.    General Date of Onset: 06/29/15 Other Pertinent Information: Pt is a 80 y.o. male with a known history of chronic kidney dis., and essential hypertension who is presenting with weakness. Family deny any dysphagia w/ po's at home. Patient is  extremely hard of hearing history obtained from family members present at bedside. They described one day duration of weakness without further localizing factors. Upon further questioning he states that he does have a cough which is chronic, unchanged denies any known fevers or chills came to Hospital further workup and evaluation. Upon arrival to the hospital noted be febrile  temperature 100.7 with increased respiratory rate. Type of Study: Bedside swallow evaluation Previous Swallow Assessment: none Diet Prior to this Study: Regular;Thin liquids (no straws; "he eats then drinks liquids all at one time") Temperature Spikes Noted: Yes Respiratory Status: Room air History of Recent Intubation: No Behavior/Cognition: Alert;Cooperative;Pleasant mood (HOH) Oral Cavity - Dentition: Adequate natural dentition/normal for age Self-Feeding Abilities: Able to feed self;Needs assist;Needs set up Patient Positioning: Upright in chair/Tumbleform Baseline Vocal Quality: Normal Volitional Cough: Strong Volitional Swallow: Able to elicit    Oral/Motor/Sensory Function Overall Oral Motor/Sensory Function: Impaired (limited ROM of labial movement(upper) d/t Bell's Palsy ) Labial ROM:  (reduced) Labial Symmetry:  (reduced) Labial Strength:  (reduced) Lingual ROM: Within Functional Limits Lingual Symmetry: Within Functional Limits Lingual Strength: Within Functional Limits Facial Symmetry: Within Functional Limits (grossly) Mandible: Within Functional Limits   Ice Chips Ice chips: Not tested Other Comments: no cold items such as ice   Thin Liquid Thin Liquid: Impaired Presentation: Cup;Self Fed Oral Phase Impairments: Reduced labial seal (baseline - slight-min.) Oral Phase Functional Implications:  (none) Pharyngeal  Phase Impairments: Cough - Delayed (at end of consecutive trials of thin liquids) Other Comments: pt tends to drink all of the liquids at one time; he drinks ~4 cups of water daily per family. Family endorses a chronic cough "at times" when drinking but nothing significant, no "choking" and no decline in pulmonary status.     Nectar Thick Nectar Thick Liquid: Not tested   Honey Thick Honey Thick Liquid: Not tested   Puree Puree: Within functional limits Presentation: Self Fed;Spoon (4 ozs)   Solid   GO    Solid: Within functional limits Presentation:  Self Fed (2 trials)      Jerilynn SomKatherine Watson, MS, CCC-SLP  Watson,Katherine 06/30/2015,12:03 PM

## 2015-06-30 NOTE — Progress Notes (Signed)
Patient ID: [name], male   DOB: 06/01/1911, [age] y.o.   MRN: [id number] [facility] PRO[facility]  PCP: Wynona DoveWALKER,JENNIFER AZBELL, MD  HPI/Subjective: Patient feels okay. Some cough with brownish phlegm. No shortness of breath no chest pain. As per the daughter, he couldn't get out of bed yesterday. In the ER found to have a pneumonia and spiked a fever.  Objective: Filed Vitals:   06/30/15 0443  BP: 150/51  Pulse: 58  Temp: 98.1 F (36.7 C)  Resp: 18    Filed Weights   06/29/15 1838 06/29/15 2225  Weight: 66.679 kg (147 lb) 68.629 kg (151 lb 4.8 oz)    ROS: Review of Systems  Constitutional: Negative for fever and chills.  Eyes: Negative for blurred vision.  Respiratory: Positive for cough and sputum production. Negative for shortness of breath.   Cardiovascular: Negative for chest pain.  Gastrointestinal: Negative for nausea, vomiting, abdominal pain, diarrhea and constipation.  Genitourinary: Negative for dysuria.  Musculoskeletal: Negative for joint pain.  Neurological: Negative for dizziness and headaches.   Exam: Physical Exam  HENT:  Nose: No mucosal edema.  Mouth/Throat: No oropharyngeal exudate or posterior oropharyngeal edema.  Eyes: Conjunctivae, EOM and lids are normal. Pupils are equal, round, and reactive to light.  Neck: No JVD present. Carotid bruit is not present. No edema present. No thyroid mass and no thyromegaly present.  Cardiovascular: S1 normal and S2 normal.  Exam reveals no gallop.   Murmur heard.  Systolic murmur is present with a grade of 2/6  Pulses:      Dorsalis pedis pulses are 2+ on the right side, and 2+ on the left side.  Respiratory: No respiratory distress. He has wheezes in the right lower field. He has no rhonchi. He has no rales.  GI: Soft. Bowel sounds are normal. There is no tenderness.  Musculoskeletal:       Right ankle: He exhibits swelling.       Left ankle: He exhibits swelling.  Lymphadenopathy:   He has no cervical adenopathy.  Neurological: He is alert. No cranial nerve deficit.  Skin: Skin is warm. No rash noted. Nails show no clubbing.  Psychiatric: He has a normal mood and affect.    Data Reviewed: Basic Metabolic Panel:  Recent Labs Lab 06/29/15 1847 06/29/15 2226 06/30/15 0606  NA 141  --  141  K 4.0  --  3.6  CL 113*  --  115*  CO2 23  --  21*  GLUCOSE 109*  --  94  BUN 41*  --  40*  CREATININE 2.21* 2.07* 2.17*  CALCIUM 8.9  --  8.5*   Liver Function Tests:  Recent Labs Lab 06/29/15 1847  AST 19  ALT 12*  ALKPHOS 53  BILITOT 0.7  PROT 5.6*  ALBUMIN 3.3*   CBC:  Recent Labs Lab 06/29/15 1847 06/29/15 2226 06/30/15 0606  WBC 10.2 9.9 9.2  NEUTROABS 9.0*  --   --   HGB 10.0* 8.9* 9.0*  HCT 30.2* 28.1* 28.0*  MCV 91.6 91.9 91.3  PLT 133* 120* 128*   Cardiac Enzymes:  Recent Labs Lab 06/29/15 1847 06/30/15 0009 06/30/15 0606  TROPONINI 0.10* 0.07* 0.08*     Recent Results (from the past 240 hour(s))  Culture, blood (routine x 2)     Status: None (Preliminary result)   Collection Time: 06/29/15  6:40 PM  Result Value Ref Range Status   Specimen Description BLOOD LEFT ARM  Final   Special Requests  BOTTLES DRAWN AEROBIC AND ANAEROBIC 11CC  Final   Culture NO GROWTH < 24 HOURS  Final   Report Status PENDING  Incomplete  Culture, blood (routine x 2)     Status: None (Preliminary result)   Collection Time: 06/29/15  6:48 PM  Result Value Ref Range Status   Specimen Description BLOOD RIGHT ARM  Final   Special Requests BOTTLES DRAWN AEROBIC AND ANAEROBIC 11CC  Final   Culture NO GROWTH < 24 HOURS  Final   Report Status PENDING  Incomplete     Studies: Dg Chest Port 1 View  06/29/2015  CLINICAL DATA:  Generalized weakness and fever since this morning. Hemoptysis. EXAM: PORTABLE CHEST 1 VIEW COMPARISON:  03/23/2015. FINDINGS: Trachea is midline. Heart size stable. Moderate hiatal hernia. Patchy airspace consolidation in the left  perihilar region. Lungs are low in volume. IMPRESSION: Patchy consolidation in the left perihilar region may be due to pneumonia or pulmonary hemorrhage. Followup PA and lateral chest X-ray is recommended in 3-4 weeks following trial of antibiotic therapy to ensure resolution and exclude underlying malignancy. Electronically Signed   By: Leanna Battles M.D.   On: 06/29/2015 19:14    Scheduled Meds: . acidophilus  1 capsule Oral Daily  . cefTRIAXone (ROCEPHIN)  IV  1 g Intravenous Q24H  . doxycycline  100 mg Oral Q12H  . ferrous sulfate  325 mg Oral Daily  . heparin  5,000 Units Subcutaneous 3 times per day  . hydrALAZINE  25 mg Oral QID  . multivitamin-lutein  1 capsule Oral Daily  . pantoprazole  40 mg Oral Daily  . sodium chloride  3 mL Intravenous Q12H    Assessment/Plan:  1. Pneumonia, fever. Patient started on IV Rocephin and oral doxycycline. Watch temperature curve. If he does not spike another temperature then he can potentially be discharged tomorrow. 2. Chronic kidney disease stage IV 3. Essential hypertension on hydralazine 4. Anemia of chronic disease on iron 5. Gastroesophageal reflux disease without esophagitis on PPI  Code Status:     Code Status Orders        Start     Ordered   06/29/15 2134  Do not attempt resuscitation (DNR)   Continuous    Question Answer Comment  In the event of cardiac or respiratory ARREST Do not call a "code blue"   In the event of cardiac or respiratory ARREST Do not perform Intubation, CPR, defibrillation or ACLS   In the event of cardiac or respiratory ARREST Use medication by any route, position, wound care, and other measures to relive pain and suffering. May use oxygen, suction and manual treatment of airway obstruction as needed for comfort.      06/29/15 2133      Family Communication: daughter at bedside.  Disposition: Potentially home tomorrow if no fever   Time spent: 25 minutes  Alford Highland  [facility]  [facility]

## 2015-06-30 NOTE — Progress Notes (Signed)
   06/30/15 1115  Clinical Encounter Type  Visited With Patient and family together  Visit Type Initial  Referral From Family  Consult/Referral To Chaplain  Spiritual Encounters  Spiritual Needs Other (Comment) (Compassionate Care)  Stress Factors  Patient Stress Factors Health changes  Family Stress Factors Health changes  Met w/patient & family. Patient was alert and active. Engaged and sharing past experiences. Provided compassionate presence & prayer. Chap. Khaleem Burchill G. [place]

## 2015-07-01 ENCOUNTER — Telehealth: Payer: Self-pay

## 2015-07-01 MED ORDER — DOXYCYCLINE HYCLATE 100 MG PO TABS: 100.0000 mg | ORAL_TABLET | Freq: Two times a day (BID) | ORAL | Status: DC

## 2015-07-01 NOTE — Telephone Encounter (Signed)
Unable to reach patient.  First TCM call attempt. Will continue to follow. 

## 2015-07-01 NOTE — Care Management (Addendum)
Patient is for discharge home today and patient and his daughter  Avon Gully(HCPOA) Daryll DrownLinda Norton is in agreement with home health nursing and physical therapy. Patient has round the clock caregiver support.  [facility] but this agency does not accept Medicare [employer].  [facility] accepts the insurance.  Referral called and faxed.  Start of care for 10/22.  Asked nursing to assess exertional room air and ambulatory status. prior to discharge.  MD will enter home health order and face to face  Discharge has been cancelled. Per primary nurse, exertional sats 93% but patient is not able to stand.  This is far from his baseline of ambulated at home with a cane.  He was ambulating to the bathroom 10/20 with one plus assist.  Daughter reports to CM that as the day has gone on, patient has become more lethargic and very drowsy.  She again stresses that this is not his baseline.Obtained order for PT consult.  If it is recommended that needs skilled nursing placement, family would be in agreement.  [facility] updated.  Of note - patient is very hard of hearing

## 2015-07-01 NOTE — Progress Notes (Signed)
Patient ID: Pedro PlumberJohnie Allen [name], male   DOB: 05/09/1911, [age] y.o.   MRN: [id number] [facility] PRO[facility]  PCP: Wynona DoveWALKER,JENNIFER AZBELL, MD  HPI/Subjective: Patient was seen earlier. Call back by care manager that he's been sleeping all day and very weak and couldn't walk. Yesterday he did walk back and forth to the bathroom. Family concerned. I will cancel discharge. Earlier patient did not offer her that many complaints. Still with some cough and some sputum.  Objective: Filed Vitals:   07/01/15 1150  BP: 148/57  Pulse: 70  Temp: 98 F (36.7 C)  Resp: 18    Filed Weights   06/29/15 1838 06/29/15 2225  Weight: 66.679 kg (147 lb) 68.629 kg (151 lb 4.8 oz)    ROS: Review of Systems  Constitutional: Negative for fever and chills.  Eyes: Negative for blurred vision.  Respiratory: Positive for cough and sputum production. Negative for shortness of breath.   Cardiovascular: Negative for chest pain.  Gastrointestinal: Negative for nausea, vomiting, abdominal pain, diarrhea and constipation.  Genitourinary: Negative for dysuria.  Musculoskeletal: Negative for joint pain.  Neurological: Negative for dizziness and headaches.   Exam: Physical Exam  HENT:  Nose: No mucosal edema.  Mouth/Throat: No oropharyngeal exudate or posterior oropharyngeal edema.  Eyes: Conjunctivae, EOM and lids are normal. Pupils are equal, round, and reactive to light.  Neck: No JVD present. Carotid bruit is not present. No edema present. No thyroid mass and no thyromegaly present.  Cardiovascular: S1 normal and S2 normal.  Exam reveals no gallop.   Murmur heard.  Systolic murmur is present with a grade of 2/6  Pulses:      Dorsalis pedis pulses are 2+ on the right side, and 2+ on the left side.  Respiratory: No respiratory distress. He has no wheezes. He has rhonchi in the right lower field. He has no rales.  GI: Soft. Bowel sounds are normal. There is no tenderness.  Musculoskeletal:     Right ankle: He exhibits swelling.       Left ankle: He exhibits swelling.  Lymphadenopathy:    He has no cervical adenopathy.  Neurological: He is alert. No cranial nerve deficit.  Skin: Skin is warm. No rash noted. Nails show no clubbing.  Psychiatric: He has a normal mood and affect.    Data Reviewed: Basic Metabolic Panel:  Recent Labs Lab 06/29/15 1847 06/29/15 2226 06/30/15 0606  NA 141  --  141  K 4.0  --  3.6  CL 113*  --  115*  CO2 23  --  21*  GLUCOSE 109*  --  94  BUN 41*  --  40*  CREATININE 2.21* 2.07* 2.17*  CALCIUM 8.9  --  8.5*   Liver Function Tests:  Recent Labs Lab 06/29/15 1847  AST 19  ALT 12*  ALKPHOS 53  BILITOT 0.7  PROT 5.6*  ALBUMIN 3.3*   CBC:  Recent Labs Lab 06/29/15 1847 06/29/15 2226 06/30/15 0606  WBC 10.2 9.9 9.2  NEUTROABS 9.0*  --   --   HGB 10.0* 8.9* 9.0*  HCT 30.2* 28.1* 28.0*  MCV 91.6 91.9 91.3  PLT 133* 120* 128*   Cardiac Enzymes:  Recent Labs Lab 06/29/15 1847 06/30/15 0009 06/30/15 0606 06/30/15 1151  TROPONINI 0.10* 0.07* 0.08* 0.07*     Recent Results (from the past 240 hour(s))  Culture, blood (routine x 2)     Status: None (Preliminary result)   Collection Time: 06/29/15  6:40 PM  Result  Value Ref Range Status   Specimen Description BLOOD LEFT ARM  Final   Special Requests BOTTLES DRAWN AEROBIC AND ANAEROBIC 11CC  Final   Culture NO GROWTH < 24 HOURS  Final   Report Status PENDING  Incomplete  Culture, blood (routine x 2)     Status: None (Preliminary result)   Collection Time: 06/29/15  6:48 PM  Result Value Ref Range Status   Specimen Description BLOOD RIGHT ARM  Final   Special Requests BOTTLES DRAWN AEROBIC AND ANAEROBIC 11CC  Final   Culture NO GROWTH < 24 HOURS  Final   Report Status PENDING  Incomplete  Urine culture     Status: None (Preliminary result)   Collection Time: 06/30/15  4:45 AM  Result Value Ref Range Status   Specimen Description URINE, RANDOM  Final   Special  Requests NONE  Final   Culture INSIGNIFICANT GROWTH  Final   Report Status PENDING  Incomplete     Studies: Dg Chest Port 1 View  06/29/2015  CLINICAL DATA:  Generalized weakness and fever since this morning. Hemoptysis. EXAM: PORTABLE CHEST 1 VIEW COMPARISON:  03/23/2015. FINDINGS: Trachea is midline. Heart size stable. Moderate hiatal hernia. Patchy airspace consolidation in the left perihilar region. Lungs are low in volume. IMPRESSION: Patchy consolidation in the left perihilar region may be due to pneumonia or pulmonary hemorrhage. Followup PA and lateral chest X-ray is recommended in 3-4 weeks following trial of antibiotic therapy to ensure resolution and exclude underlying malignancy. Electronically Signed   By: Leanna Battles M.D.   On: 06/29/2015 19:14    Scheduled Meds: . acidophilus  1 capsule Oral Daily  . cefTRIAXone (ROCEPHIN)  IV  1 g Intravenous Q24H  . doxycycline  100 mg Oral Q12H  . ferrous sulfate  325 mg Oral Daily  . heparin  5,000 Units Subcutaneous 3 times per day  . hydrALAZINE  25 mg Oral 3 times per day  . multivitamin-lutein  1 capsule Oral Daily  . pantoprazole  40 mg Oral Daily  . sodium chloride  3 mL Intravenous Q12H    Assessment/Plan:  1. Pneumonia, fever. Patient started on IV Rocephin and oral doxycycline. Temperature curve better. Since the patient is weak I will hold off on discharge today. Evaluate tomorrow. Get physical therapy evaluation. 2. Chronic kidney disease stage IV 3. Essential hypertension on hydralazine 4. Anemia of chronic disease on iron 5. Gastroesophageal reflux disease without esophagitis on PPI  Code Status:     Code Status Orders        Start     Ordered   06/29/15 2134  Do not attempt resuscitation (DNR)   Continuous    Question Answer Comment  In the event of cardiac or respiratory ARREST Do not call a "code blue"   In the event of cardiac or respiratory ARREST Do not perform Intubation, CPR, defibrillation or ACLS    In the event of cardiac or respiratory ARREST Use medication by any route, position, wound care, and other measures to relive pain and suffering. May use oxygen, suction and manual treatment of airway obstruction as needed for comfort.      06/29/15 2133      Family Communication: daughter at bedside.  Disposition: Home soon with home health.  Time spent: 40 minutes  Alford Highland  [facility]

## 2015-07-01 NOTE — Discharge Instructions (Addendum)
°  DIET:  Regular diet  DISCHARGE CONDITION:  Stable  ACTIVITY:  Activity as tolerated  OXYGEN:  Home Oxygen: No.   Oxygen Delivery: room air  DISCHARGE LOCATION:  home with home health and pt and rn   ADDITIONAL DISCHARGE INSTRUCTION:   If you experience worsening of your admission symptoms, develop shortness of breath, life threatening emergency, suicidal or homicidal thoughts you must seek medical attention immediately by calling 911 or calling your MD immediately  if symptoms less severe.  You Must read complete instructions/literature along with all the possible adverse reactions/side effects for all the Medicines you take and that have been prescribed to you. Take any new Medicines after you have completely understood and accpet all the possible adverse reactions/side effects.   Please note  You were cared for by a hospitalist during your hospital stay. If you have any questions about your discharge medications or the care you received while you were in the hospital after you are discharged, you can call the unit and asked to speak with the hospitalist on call if the hospitalist that took care of you is not available. Once you are discharged, your primary care physician will handle any further medical issues. Please note that NO REFILLS for any discharge medications will be authorized once you are discharged, as it is imperative that you return to your primary care physician (or establish a relationship with a primary care physician if you do not have one) for your aftercare needs so that they can reassess your need for medications and monitor your lab values.

## 2015-07-01 NOTE — Care Management Important Message (Signed)
Important Message  Patient Details  Name: [name] MRN: [id number] Date of Birth: 05/24/1911   Medicare Important Message Given:  Yes-second notification given    Olegario MessierKathy A Allmond 07/01/2015, 11:17 AM

## 2015-07-01 NOTE — Discharge Summary (Signed)
[facility] [place] [facility]   PATIENT NAME: [name]    MR#:  [id number]  DATE OF BIRTH:  06/21/1911  DATE OF ADMISSION:  06/29/2015 ADMITTING PHYSICIAN: Wyatt Hasteavid K Hower, MD  DATE OF DISCHARGE: 07/01/2015  PRIMARY CARE PHYSICIAN: Wynona DoveWALKER,JENNIFER AZBELL, MD    ADMISSION DIAGNOSIS:  Aspiration pneumonia, unspecified aspiration pneumonia type, unspecified laterality, unspecified part of lung (HCC) [J69.0]  DISCHARGE DIAGNOSIS:  Principal Problem:   Community acquired pneumonia   SECONDARY DIAGNOSIS:   Past Medical History  Diagnosis Date  . Chronic kidney disease   . Syncope and collapse June 2012    pre syncopal  . Hypertension     HOSPITAL COURSE:   1. Community-acquired pneumonia with fever. The patient was started on Rocephin and doxycycline in the hospital. Switched over to oral doxycycline upon discharge home. 2. Borderline elevated troponin demand ischemia from pneumonia. 3. Hypertension essential on hydralazine. 4. Chronic kidney disease stage IV- continue to watch as outpatient. 5. Gastroesophageal reflux disease without esophagitis. 6. Anemia of chronic disease- patient on iron  DISCHARGE CONDITIONS:   Satisfactory  CONSULTS OBTAINED:  Treatment Team:  Wyatt Hasteavid K Hower, MD  DRUG ALLERGIES:   Allergies  Allergen Reactions  . Amoxicillin Other (See Comments)    Reaction:  Unknown   . Clarithromycin Other (See Comments)    Reaction:  Unknown   . Influenza Vaccines Other (See Comments)    Reaction:  Unknown   . Levofloxacin Other (See Comments)    Reaction:  Unknown   . Macrodantin Other (See Comments)    Reaction:  Unknown   . Metronidazole Other (See Comments)    Reaction:  Unknown   . Micardis [Telmisartan] Other (See Comments)    Reaction:  Unknown   . Nabumetone Other (See Comments)    Reaction:  Unknown   . Neosporin [Neomycin-Polymyxin-Gramicidin] Other (See Comments)    Reaction:  Unknown   .  Penicillins Other (See Comments)    Reaction:  Unknown   . Sulfa Drugs Cross Reactors Other (See Comments)    Reaction:  Unknown   . Zithromax [Azithromycin] Diarrhea and Other (See Comments)    Reaction:  Dizziness and headaches    DISCHARGE MEDICATIONS:   Current Discharge Medication List    START taking these medications   Details  doxycycline (VIBRA-TABS) 100 MG tablet Take 1 tablet (100 mg total) by mouth every 12 (twelve) hours. Qty: 18 tablet, Refills: 0      CONTINUE these medications which have NOT CHANGED   Details  acidophilus (RISAQUAD) CAPS capsule Take 1 capsule by mouth daily.    ferrous sulfate 325 (65 FE) MG EC tablet Take 325 mg by mouth daily.    glucosamine-chondroitin 500-400 MG tablet Take 1 tablet by mouth 3 (three) times daily.    hydrALAZINE (APRESOLINE) 25 MG tablet Take 25 mg by mouth 4 (four) times daily.    multivitamin-lutein (OCUVITE-LUTEIN) CAPS capsule Take 1 capsule by mouth daily.    nystatin-triamcinolone ointment (MYCOLOG) Apply 1 application topically 2 (two) times daily as needed (for bed sores/ulcers).    omeprazole (PRILOSEC) 20 MG capsule Take 20 mg by mouth daily.         DISCHARGE INSTRUCTIONS:   Follow-up medical doctor one week  If you experience worsening of your admission symptoms, develop shortness of breath, life threatening emergency, suicidal or homicidal thoughts you must seek medical attention immediately by calling 911 or calling your MD immediately  if symptoms less severe.  You Must read complete instructions/literature along with all the possible adverse reactions/side effects for all the Medicines you take and that have been prescribed to you. Take any new Medicines after you have completely understood and accept all the possible adverse reactions/side effects.   Please note  You were cared for by a hospitalist during your hospital stay. If you have any questions about your discharge medications or the care you  received while you were in the hospital after you are discharged, you can call the unit and asked to speak with the hospitalist on call if the hospitalist that took care of you is not available. Once you are discharged, your primary care physician will handle any further medical issues. Please note that NO REFILLS for any discharge medications will be authorized once you are discharged, as it is imperative that you return to your primary care physician (or establish a relationship with a primary care physician if you do not have one) for your aftercare needs so that they can reassess your need for medications and monitor your lab values.    Today   CHIEF COMPLAINT:   Chief Complaint  Patient presents with  . Weakness  . Fever    HISTORY OF PRESENT ILLNESS:  [name]  is a 81 y.o. male with a known history of chronic kidney disease stage IV and hypertension. He presented with altered mental status and weakness and found to have a pneumonia. He also had a fever in the emergency room.   VITAL SIGNS:  Blood pressure 146/52, pulse 71, temperature 99.3 F (37.4 C), temperature source Oral, resp. rate 22, height  (1.549 m), weight 68.629 kg (151 lb 4.8 oz), SpO2 92 %.    PHYSICAL EXAMINATION:  GENERAL:  81 y.o.-year-old patient lying in the bed with no acute distress.  EYES: Pupils equal, round, reactive to light and accommodation. No scleral icterus. Extraocular muscles intact.  HEENT: Head atraumatic, normocephalic. Oropharynx and nasopharynx clear.  NECK:  Supple, no jugular venous distention. No thyroid enlargement, no tenderness.  LUNGS: Normal breath sounds bilaterally, no wheezing, rales,rhonchi or crepitation. No use of accessory muscles of respiration.  CARDIOVASCULAR: S1, S2 normal. 2/6 systolic murmurs, no rubs, or gallops.  ABDOMEN: Soft, non-tender, non-distended. Bowel sounds present. No organomegaly or mass.  EXTREMITIES: 2+ edema, no cyanosis, or clubbing.   NEUROLOGIC: Cranial nerves II through XII are intact. Muscle strength 5/5 in all extremities. Sensation intact. Gait not checked.  PSYCHIATRIC: The patient is alert and answers all questions.Marland Kitchen  SKIN: No obvious rash, lesion, or ulcer.   DATA REVIEW:   CBC  Recent Labs Lab 06/30/15 0606  WBC 9.2  HGB 9.0*  HCT 28.0*  PLT 128*    Chemistries   Recent Labs Lab 06/29/15 1847  06/30/15 0606  NA 141  --  141  K 4.0  --  3.6  CL 113*  --  115*  CO2 23  --  21*  GLUCOSE 109*  --  94  BUN 41*  --  40*  CREATININE 2.21*  < > 2.17*  CALCIUM 8.9  --  8.5*  AST 19  --   --   ALT 12*  --   --   ALKPHOS 53  --   --   BILITOT 0.7  --   --   < > = values in this interval not displayed.  Cardiac Enzymes  Recent Labs Lab 06/30/15 1151  TROPONINI 0.07*    Microbiology Results  Results for  orders placed or performed during the hospital encounter of 06/29/15  Culture, blood (routine x 2)     Status: None (Preliminary result)   Collection Time: 06/29/15  6:40 PM  Result Value Ref Range Status   Specimen Description BLOOD LEFT ARM  Final   Special Requests BOTTLES DRAWN AEROBIC AND ANAEROBIC 11CC  Final   Culture NO GROWTH < 24 HOURS  Final   Report Status PENDING  Incomplete  Culture, blood (routine x 2)     Status: None (Preliminary result)   Collection Time: 06/29/15  6:48 PM  Result Value Ref Range Status   Specimen Description BLOOD RIGHT ARM  Final   Special Requests BOTTLES DRAWN AEROBIC AND ANAEROBIC 11CC  Final   Culture NO GROWTH < 24 HOURS  Final   Report Status PENDING  Incomplete  Urine culture     Status: None (Preliminary result)   Collection Time: 06/30/15  4:45 AM  Result Value Ref Range Status   Specimen Description URINE, RANDOM  Final   Special Requests NONE  Final   Culture INSIGNIFICANT GROWTH  Final   Report Status PENDING  Incomplete    RADIOLOGY:  Dg Chest Port 1 View  06/29/2015  CLINICAL DATA:  Generalized weakness and fever since this  morning. Hemoptysis. EXAM: PORTABLE CHEST 1 VIEW COMPARISON:  03/23/2015. FINDINGS: Trachea is midline. Heart size stable. Moderate hiatal hernia. Patchy airspace consolidation in the left perihilar region. Lungs are low in volume. IMPRESSION: Patchy consolidation in the left perihilar region may be due to pneumonia or pulmonary hemorrhage. Followup PA and lateral chest X-ray is recommended in 3-4 weeks following trial of antibiotic therapy to ensure resolution and exclude underlying malignancy. Electronically Signed   By: Leanna Battles M.D.   On: 06/29/2015 19:14   Management plans discussed with the patient, family and they are in agreement.  CODE STATUS:     Code Status Orders        Start     Ordered   06/29/15 2134  Do not attempt resuscitation (DNR)   Continuous    Question Answer Comment  In the event of cardiac or respiratory ARREST Do not call a "code blue"   In the event of cardiac or respiratory ARREST Do not perform Intubation, CPR, defibrillation or ACLS   In the event of cardiac or respiratory ARREST Use medication by any route, position, wound care, and other measures to relive pain and suffering. May use oxygen, suction and manual treatment of airway obstruction as needed for comfort.      06/29/15 2133      TOTAL TIME TAKING CARE OF THIS PATIENT: 35 minutes.    Alford Highland M.D on 07/01/2015 at 8:57 AM  Between 7am to 6pm - Pager - [phone]  After 6pm go to [email or web address] - password [email or web address] [facility]  [facility]  Office  [phone]  CC: Primary care physician; Wynona Dove, MD

## 2015-07-02 LAB — BASIC METABOLIC PANEL
ANION GAP: 6 (ref 5–15)
BUN: 32 mg/dL — ABNORMAL HIGH (ref 6–20)
CALCIUM: 9 mg/dL (ref 8.9–10.3)
CO2: 21 mmol/L — ABNORMAL LOW (ref 22–32)
Chloride: 114 mmol/L — ABNORMAL HIGH (ref 101–111)
Creatinine, Ser: 1.93 mg/dL — ABNORMAL HIGH (ref 0.61–1.24)
GFR, EST AFRICAN AMERICAN: 31 mL/min — AB (ref >60)
GFR, EST NON AFRICAN AMERICAN: 26 mL/min — AB (ref >60)
Glucose, Bld: 105 mg/dL — ABNORMAL HIGH (ref 65–99)
POTASSIUM: 3.6 mmol/L (ref 3.5–5.1)
Sodium: 141 mmol/L (ref 135–145)

## 2015-07-02 LAB — URINE CULTURE

## 2015-07-02 MED ORDER — CEFUROXIME AXETIL 500 MG PO TABS: 500.0000 mg | ORAL_TABLET | Freq: Two times a day (BID) | ORAL | Status: DC

## 2015-07-02 MED ORDER — GUAIFENESIN ER 600 MG PO TB12: 600.0000 mg | ORAL_TABLET | Freq: Two times a day (BID) | ORAL | Status: DC

## 2015-07-02 NOTE — Progress Notes (Signed)
Patient ambulated around nurses station with walker, gait steady. Patient sob halfway through walking. Sat 94% on RA with exertion. Spoke with Larita FifeLynn the case manager to make aware of results. Per lynn patient ready to be discharged home, does not qualify for home o2.

## 2015-07-02 NOTE — Discharge Summary (Signed)
[name], [place] y.o., DOB 06-15-1911, MRN [id number]. Admission date: 06/29/2015 Discharge Date 07/02/2015 Primary MD Wynona Dove, MD Admitting Physician Wyatt Haste, MD  Admission Diagnosis  Aspiration pneumonia, unspecified aspiration pneumonia type, unspecified laterality, unspecified part of lung [facility]) [J69.0]  Discharge Diagnosis   Principal Problem:   Community acquired pneumonia  generalized weakness Fever Chronic kidney disease History of syncope and collapse Hypertension        Hospital Course  [name] is a 81 y.o. male with a known history of essential hypertension who is presenting with weakness. Patient was noted to have fever on arrival and was noted to have pneumonia. He was admitted for community-acquired pneumonia. He was slow to improve. He is afebrile his WBC count is normal now. He was also seen by speech and it was no evidence of aspiration. Patient yesterday was very weak and had difficulty with walking but then was seen by physical therapy and able to ambulate today. She is having home health with physical therapy being arranged. Currently is stable for discharge.           Consults  None  Significant Tests:  See full reports for all details    Dg Chest Port 1 View  06/29/2015  CLINICAL DATA:  Generalized weakness and fever since this morning. Hemoptysis. EXAM: PORTABLE CHEST 1 VIEW COMPARISON:  03/23/2015. FINDINGS: Trachea is midline. Heart size stable. Moderate hiatal hernia. Patchy airspace consolidation in the left perihilar region. Lungs are low in volume. IMPRESSION: Patchy consolidation in the left perihilar region may be due to pneumonia or pulmonary hemorrhage. Followup PA and lateral chest X-ray is recommended in 3-4 weeks following trial of antibiotic therapy to ensure resolution and exclude underlying malignancy. Electronically Signed   By: Leanna Battles M.D.   On: 06/29/2015 19:14       Today   Subjective:    [name]  feeling better, complains of feeling cold , and 2 history of cough  Objective:   Blood pressure 166/62, pulse 66, temperature 97.7 F (36.5 C), temperature source Oral, resp. rate 17, height  (1.549 m), weight 68.629 kg (151 lb 4.8 oz), SpO2 95 %.  .  Intake/Output Summary (Last 24 hours) at 07/02/15 1214 Last data filed at 07/02/15 0957  Gross per 24 hour  Intake    170 ml  Output    475 ml  Net   -305 ml    Exam VITAL SIGNS: Blood pressure 166/62, pulse 66, temperature 97.7 F (36.5 C), temperature source Oral, resp. rate 17, height  (1.549 m), weight 68.629 kg (151 lb 4.8 oz), SpO2 95 %.  GENERAL:  81 y.o.-year-old patient lying in the bed with no acute distress.  EYES: Pupils equal, round, reactive to light and accommodation. No scleral icterus. Extraocular muscles intact.  HEENT: Head atraumatic, normocephalic. Oropharynx and nasopharynx clear.  NECK:  Supple, no jugular venous distention. No thyroid enlargement, no tenderness.  LUNGS: Normal breath sounds bilaterally, no wheezing, rales,rhonchi or crepitation. No use of accessory muscles of respiration.  CARDIOVASCULAR: S1, S2 normal. No murmurs, rubs, or gallops.  ABDOMEN: Soft, nontender, nondistended. Bowel sounds present. No organomegaly or mass.  EXTREMITIES: No pedal edema, cyanosis, or clubbing.  NEUROLOGIC: Cranial nerves II through XII are intact. Muscle strength 5/5 in all extremities. Sensation intact. Gait not checked.  PSYCHIATRIC: The patient is alert and oriented x 3.  SKIN: No obvious rash, lesion, or ulcer.   Data Review     CBC  w Diff: Lab Results  Component Value Date   WBC 9.2 06/30/2015   WBC 9.3 09/14/2013   HGB 9.0* 06/30/2015   HGB 10.1* 09/14/2013   HCT 28.0* 06/30/2015   HCT 30.0* 09/14/2013   PLT 128* 06/30/2015   PLT 191 09/14/2013   LYMPHOPCT 7 06/29/2015   LYMPHOPCT 30.4 05/19/2012   MONOPCT 5 06/29/2015   MONOPCT 8.5 05/19/2012   EOSPCT 0 06/29/2015    EOSPCT 5.5 05/19/2012   BASOPCT 0 06/29/2015   BASOPCT 1.0 05/19/2012   CMP: Lab Results  Component Value Date   NA 141 07/02/2015   NA 133* 09/14/2013   K 3.6 07/02/2015   K 4.9 09/14/2013   CL 114* 07/02/2015   CL 106 09/14/2013   CO2 21* 07/02/2015   CO2 19* 09/14/2013   BUN 32* 07/02/2015   BUN 28* 09/14/2013   CREATININE 1.93* 07/02/2015   CREATININE 2.08* 09/16/2013   CREATININE 2.31* 08/01/2012   PROT 5.6* 06/29/2015   PROT 6.4 05/18/2012   ALBUMIN 3.3* 06/29/2015   ALBUMIN 3.3* 05/18/2012   BILITOT 0.7 06/29/2015   BILITOT 0.3 05/18/2012   ALKPHOS 53 06/29/2015   ALKPHOS 70 05/18/2012   AST 19 06/29/2015   AST 24 05/18/2012   ALT 12* 06/29/2015   ALT 14 05/18/2012  .  Micro Results Recent Results (from the past 240 hour(s))  Culture, blood (routine x 2)     Status: None (Preliminary result)   Collection Time: 06/29/15  6:40 PM  Result Value Ref Range Status   Specimen Description BLOOD LEFT ARM  Final   Special Requests BOTTLES DRAWN AEROBIC AND ANAEROBIC 11CC  Final   Culture NO GROWTH 3 DAYS  Final   Report Status PENDING  Incomplete  Culture, blood (routine x 2)     Status: None (Preliminary result)   Collection Time: 06/29/15  6:48 PM  Result Value Ref Range Status   Specimen Description BLOOD RIGHT ARM  Final   Special Requests BOTTLES DRAWN AEROBIC AND ANAEROBIC 11CC  Final   Culture NO GROWTH 3 DAYS  Final   Report Status PENDING  Incomplete  Urine culture     Status: None   Collection Time: 06/30/15  4:45 AM  Result Value Ref Range Status   Specimen Description URINE, RANDOM  Final   Special Requests NONE  Final   Culture INSIGNIFICANT GROWTH  Final   Report Status 07/02/2015 FINAL  Final        Code Status Orders        Start     Ordered   06/29/15 2134  Do not attempt resuscitation (DNR)   Continuous    Question Answer Comment  In the event of cardiac or respiratory ARREST Do not call a "code blue"   In the event of cardiac or  respiratory ARREST Do not perform Intubation, CPR, defibrillation or ACLS   In the event of cardiac or respiratory ARREST Use medication by any route, position, wound care, and other measures to relive pain and suffering. May use oxygen, suction and manual treatment of airway obstruction as needed for comfort.      06/29/15 2133          Follow-up Information    Follow up with Wynona Dove, MD In 1 week.   Specialty:  Internal Medicine   Why:  Tuesday, October 25th at 10am, [employer] information:   [address] [place] [address] [phone]  Discharge Medications     Medication List    TAKE these medications        acidophilus Caps capsule  Take 1 capsule by mouth daily.     cefUROXime 500 MG tablet  Commonly known as:  CEFTIN  Take 1 tablet (500 mg total) by mouth 2 (two) times daily with a meal.     doxycycline 100 MG tablet  Commonly known as:  VIBRA-TABS  Take 1 tablet (100 mg total) by mouth every 12 (twelve) hours.  Notes to Patient:  Take 2nd dose this evening     ferrous sulfate 325 (65 FE) MG EC tablet  Take 325 mg by mouth daily.     glucosamine-chondroitin 500-400 MG tablet  Take 1 tablet by mouth 3 (three) times daily.     guaiFENesin 600 MG 12 hr tablet  Commonly known as:  MUCINEX  Take 1 tablet (600 mg total) by mouth 2 (two) times daily.     hydrALAZINE 25 MG tablet  Commonly known as:  APRESOLINE  Take 25 mg by mouth 4 (four) times daily.     multivitamin-lutein Caps capsule  Take 1 capsule by mouth daily.     nystatin-triamcinolone ointment  Commonly known as:  MYCOLOG  Apply 1 application topically 2 (two) times daily as needed (for bed sores/ulcers).     omeprazole 20 MG capsule  Commonly known as:  PRILOSEC  Take 20 mg by mouth daily.           Total Time in preparing paper work, data evaluation and todays exam - 35 minutes  Auburn BilberryPATEL, Haroun Cotham M.D on 07/02/2015 at 12:14 PM  [facility]   Office  [phone]

## 2015-07-02 NOTE — Evaluation (Signed)
Physical Therapy Evaluation Patient Details Name: [name] MRN: [id number] DOB: 1911/09/04 Today's Date: 07/02/2015   History of Present Illness  81 yo male with onset of weakness and low endurance after having PNA and being independent prior to this, living at home with daughters sharing the care of him.  Clinical Impression  Pt is demonstrating some real improvement over yesterday and will be able to go home with daughters helping to gether instead of usual one person at a time.  Agreement with family on the phone for one to come help the other for momentary needs, with better safety and recovery for the pt.    Follow Up Recommendations Home health PT;Supervision/Assistance - 24 hour    Equipment Recommendations  None recommended by PT    Recommendations for Other Services       Precautions / Restrictions Precautions Precautions: Fall (telemetry) Restrictions Weight Bearing Restrictions: No      Mobility  Bed Mobility Overal bed mobility: Needs Assistance Bed Mobility: Supine to Sit;Sit to Supine     Supine to sit: Mod assist Sit to supine: +2 for physical assistance;+2 for safety/equipment;Mod assist   General bed mobility comments: Pt wants to try to do alone but could not pivot adequately  Transfers Overall transfer level: Needs assistance Equipment used: 1 person hand held assist;Rolling walker (2 wheeled) Transfers: Sit to/from [employer] Sit to Stand: Mod assist;From elevated surface (to power up) Stand pivot transfers: Min assist       General transfer comment: standing up requires more help from lower surfaces  Ambulation/Gait Ambulation/Gait assistance: Min assist;Mod assist Ambulation Distance (Feet): 150 Feet Assistive device: Rolling walker (2 wheeled);1 person hand held assist Gait Pattern/deviations: Step-to pattern;Step-through pattern;Shuffle;Wide base of support;Trunk flexed (pushes walker out too far in front of  him) Gait velocity: slower Gait velocity interpretation: Below normal speed for age/gender    Stairs            Wheelchair Mobility    Modified Rankin (Stroke Patients Only)       Balance Overall balance assessment: Needs assistance Sitting-balance support: Feet supported Sitting balance-Leahy Scale: Fair   Postural control: Posterior lean Standing balance support: Bilateral upper extremity supported Standing balance-Leahy Scale: Poor                               Pertinent Vitals/Pain Pain Assessment: No/denies pain    Home Living Family/patient expects to be discharged to:: Private residence Living Arrangements: Children Available Help at Discharge: Family;Available 24 hours/day Type of Home: House Home Access: Ramped entrance     Home Layout: One level Home Equipment: Walker - 2 wheels;Cane - single point;Bedside commode;Shower seat;Wheelchair - manual Additional Comments: family has all equipment needed    Prior Function Level of Independence: Independent (family has used RW only after PNA)               Hand Dominance        Extremity/Trunk Assessment   Upper Extremity Assessment: Generalized weakness           Lower Extremity Assessment: Generalized weakness      Cervical / Trunk Assessment: Kyphotic  Communication   Communication: Expressive difficulties (no dentition)  Cognition Arousal/Alertness: Awake/alert Behavior During Therapy: WFL for tasks assessed/performed Overall Cognitive Status: Within Functional Limits for tasks assessed  General Comments General comments (skin integrity, edema, etc.): Pt was able to  control walker fairly well but tends to get hung up on doorways and obstacles     Exercises        Assessment/Plan    PT Assessment Patient needs continued PT services  PT Diagnosis Difficulty walking;Generalized weakness   PT Problem List Decreased strength;Decreased  range of motion;Decreased activity tolerance;Decreased balance;Decreased mobility;Decreased coordination;Decreased knowledge of use of DME;Decreased safety awareness;Cardiopulmonary status limiting activity;Decreased skin integrity  PT Treatment Interventions DME instruction;Gait training;Functional mobility training;Therapeutic activities;Therapeutic exercise;Balance training;Neuromuscular re-education;Patient/family education   PT Goals (Current goals can be found in the Care Plan section) Acute Rehab PT Goals Patient Stated Goal: to go home PT Goal Formulation: With patient/family Time For Goal Achievement: 07/16/15 Potential to Achieve Goals: Good    Frequency Min 2X/week   Barriers to discharge Other (comment) (2 person assist to get OOB and in bed)      Co-evaluation               End of Session Equipment Utilized During Treatment: Gait belt Activity Tolerance: Patient tolerated treatment well Patient left: in bed;with call bell/phone within reach;with bed alarm set;with family/visitor present Nurse Communication: Mobility status         Time: [id number] PT Time Calculation (min) (ACUTE ONLY): 33 min   Charges:   PT Evaluation $Initial PT Evaluation Tier I: 1 Procedure PT Treatments $Gait Training: 8-22 mins   PT G CodesIvar [name]:        [name] 07/02/2015, 1:35 PM  [name] [name], PT MS Acute Rehab Dept. Number: [facility] [id number] and [facility] [phone]

## 2015-07-02 NOTE — Progress Notes (Signed)
Discharge instructions along with home medication list and follow up gone over with patients daughter. Also made the daughter aware rx have been electronically [facility] to [facility] per md. dnr returned with patients discharge instructions. Daughter verbalized that she understood instructions. Will removed iv's and telemetry when ems arrives to transport patient. Will call ems for transport.

## 2015-07-02 NOTE — Care Management Note (Signed)
Case Management Note  Patient Details  Name: [name] MRN: [id number] Date of Birth: 03/04/1911  Subjective/Objective:      Request for home health PT, RN, [facility] Discharge home today.               Action/Plan:   Expected Discharge Date:                  Expected Discharge Plan:     In-House Referral:     Discharge planning Services     Post Acute Care Choice:    Choice offered to:     DME Arranged:    DME Agency:     HH Arranged:    HH Agency:     Status of Service:     Medicare Important Message Given:  Yes-second notification given Date Medicare IM Given:    Medicare IM give by:    Date Additional Medicare IM Given:    Additional Medicare Important Message give by:     If discussed at Long Length of Stay Meetings, dates discussed:    Additional Comments:  Aodhan Scheidt A, RN 07/02/2015, 12:41 PM

## 2015-07-04 ENCOUNTER — Telehealth: Payer: Self-pay

## 2015-07-04 NOTE — Telephone Encounter (Signed)
Transition Care Management Follow-up Telephone Call   Date discharged? 07/02/15   How have you been since you were released from the hospital? Improved. Doesn't seem to be as weak, but sleeping very good. Has been ambulating with a walker.   Do you understand why you were in the hospital? Yes   Do you understand the discharge instructions? Yes   Where were you discharged to? Home   Items Reviewed:  Medications reviewed: Yes  Allergies reviewed: Yes  Dietary changes reviewed: Yes  Referrals reviewed: Yes   Functional Questionnaire:   Activities of Daily Living (ADLs):   He states they are independent in the following: Self feeds, grooming States they require assistance with the following: Ambulating (uses walker), dressing, bathing, meal preparation. home health and children assist.   Any transportation issues/concerns?: No, children to bring.   Any patient concerns? Still running a low fever of 99.7, however monitored around the clock by home health and children.   Confirmed importance and date/time of follow-up visits scheduled Yes, appointment scheduled 07/05/15.  Provider Appointment booked with Dr. Dan HumphreysWalker (PCP).  Confirmed with patient if condition begins to worsen call PCP or go to the ER.  Patient was given the office number and encouraged to call back with question or concerns.  : Yes, daughter Bonita Quin(Linda) verbalized understanding.

## 2015-07-05 ENCOUNTER — Encounter: Payer: Self-pay | Admitting: Internal Medicine

## 2015-07-05 ENCOUNTER — Ambulatory Visit (INDEPENDENT_AMBULATORY_CARE_PROVIDER_SITE_OTHER): Payer: Medicare Other | Admitting: Internal Medicine

## 2015-07-05 VITALS — BP 143/61 | HR 81 | Temp 98.1°F | Ht 63.0 in | Wt 149.2 lb

## 2015-07-05 DIAGNOSIS — J189 Pneumonia, unspecified organism: Secondary | ICD-10-CM

## 2015-07-05 DIAGNOSIS — D649 Anemia, unspecified: Secondary | ICD-10-CM

## 2015-07-05 LAB — BASIC METABOLIC PANEL
BUN: 34 mg/dL — AB (ref 6–23)
CALCIUM: 9.2 mg/dL (ref 8.4–10.5)
CO2: 23 mEq/L (ref 19–32)
Chloride: 110 mEq/L (ref 96–112)
Creatinine, Ser: 2.03 mg/dL — ABNORMAL HIGH (ref 0.40–1.50)
GFR: 32.11 mL/min — ABNORMAL LOW (ref >60.00)
GLUCOSE: 102 mg/dL — AB (ref 70–99)
POTASSIUM: 4 meq/L (ref 3.5–5.1)
Sodium: 140 mEq/L (ref 135–145)

## 2015-07-05 LAB — CBC
HEMATOCRIT: 29.1 % — AB (ref 39.0–52.0)
Hemoglobin: 9.6 g/dL — ABNORMAL LOW (ref 13.0–17.0)
MCHC: 32.9 g/dL (ref 30.0–36.0)
MCV: 90.2 fl (ref 78.0–100.0)
PLATELETS: 202 10*3/uL (ref 150.0–400.0)
RBC: 3.22 Mil/uL — AB (ref 4.22–5.81)
RDW: 15.3 % (ref 11.5–15.5)
WBC: 4.3 10*3/uL (ref 4.0–10.5)

## 2015-07-05 LAB — CULTURE, BLOOD (ROUTINE X 2)
Culture: NO GROWTH
Culture: NO GROWTH

## 2015-07-05 NOTE — Patient Instructions (Addendum)
Continue current antibiotics.  Labs today.  Follow up in 2 weeks.

## 2015-07-05 NOTE — Assessment & Plan Note (Signed)
Reviewed hospitalization with pt and his two daughters. He is doing well and symptoms improving. Will continue Cefuroxime and Doxycycline. Follow up if any worsening cough, fever or other concerns. Labs today including CBC and BMP. Follow up in 2 weeks with plan to repeat CXR.

## 2015-07-05 NOTE — Progress Notes (Signed)
Subjective:    Patient ID: [name], male    DOB: [birth year], 81 y.o.   MRN: [id number]  HPI  81YO male presents for hospital follow up.  ADMITTED: 06/29/2015 DISCHARGED: 07/02/2015  DIAGNOSIS: Community Acquired Pneumonia  Presented with generalized weakness and cough. CXR showed left perihilar pneumonia versus hemorrhage. Started on antibiotics. Discharged home on Cefuroxime and Doxycycline.  Feeling better. No dyspnea. Temp yesterday 99.93F. Coughing productive of some mucous. Continues to be weak at times, however this is similar to baseline.  Wt Readings from Last 3 Encounters:  07/05/15 149 lb 4 oz (67.699 kg)  06/29/15 151 lb 4.8 oz (68.629 kg)  04/29/15 146 lb 4 oz (66.339 kg)   BP Readings from Last 3 Encounters:  07/05/15 143/61  07/02/15 166/62  04/29/15 157/63    Past Medical History  Diagnosis Date  . Chronic kidney disease   . Syncope and collapse June 2012    pre syncopal  . Hypertension    Family History  Problem Relation Age of Onset  . Hypertension Other   . Diabetes Neg Hx    Past Surgical History  Procedure Laterality Date  . Prostate surgery    . Intraocular lens implant, secondary  1990    bilateral implant  . Gallbladder surgery    . Hernia repair    . Kidney stone surgery     Social History   Social History  . Marital Status: Married    Spouse Name: N/A  . Number of Children: N/A  . Years of Education: N/A   Social History Main Topics  . Smoking status: Former Smoker -- 1.00 packs/day for 20 years    Types: Cigarettes    Quit date: 10/01/1949  . Smokeless tobacco: Never Used  . Alcohol Use: No  . Drug Use: No  . Sexual Activity: Not Asked   Other Topics Concern  . None   Social History Narrative    Review of Systems  Constitutional: Positive for fatigue. Negative for fever, chills, activity change, appetite change and unexpected weight change.  Eyes: Negative for visual disturbance.  Respiratory:  Positive for cough. Negative for shortness of breath.   Cardiovascular: Negative for chest pain, palpitations and leg swelling.  Gastrointestinal: Negative for nausea, vomiting, abdominal pain, diarrhea, constipation and abdominal distention.  Genitourinary: Negative for dysuria, urgency and difficulty urinating.  Musculoskeletal: Negative for arthralgias and gait problem.  Skin: Negative for color change and rash.  Neurological: Positive for weakness.  Hematological: Negative for adenopathy.  Psychiatric/Behavioral: Negative for sleep disturbance and dysphoric mood. The patient is not nervous/anxious.        Objective:    BP 143/61 mmHg  Pulse 81  Temp(Src) 98.1 F (36.7 C) (Oral)  Ht  (1.6 m)  Wt 149 lb 4 oz (67.699 kg)  BMI 26.44 kg/m2  SpO2 97% Physical Exam  Constitutional: He is oriented to person, place, and time. He appears well-developed and well-nourished. No distress.  HENT:  Head: Normocephalic and atraumatic.  Right Ear: External ear normal.  Left Ear: External ear normal.  Nose: Nose normal.  Mouth/Throat: Oropharynx is clear and moist.  Eyes: Conjunctivae and EOM are normal. Pupils are equal, round, and reactive to light. Right eye exhibits no discharge. Left eye exhibits no discharge. No scleral icterus.  Neck: Normal range of motion. Neck supple. No tracheal deviation present. No thyromegaly present.  Cardiovascular: Normal rate, regular rhythm and normal heart sounds.  Exam reveals no gallop and no friction  rub.   No murmur heard. Pulmonary/Chest: Effort normal and breath sounds normal. No accessory muscle usage. No tachypnea. No respiratory distress. He has no decreased breath sounds. He has no wheezes. He has no rhonchi. He has no rales. He exhibits no tenderness.  Musculoskeletal: Normal range of motion. He exhibits no edema.  Lymphadenopathy:    He has no cervical adenopathy.  Neurological: He is alert and oriented to person, place, and time. No  cranial nerve deficit. Coordination normal.  Skin: Skin is warm and dry. No rash noted. He is not diaphoretic. No erythema. No pallor.  Psychiatric: He has a normal mood and affect. His behavior is normal. Judgment and thought content normal.          Assessment & Plan:   Problem List Items Addressed This Visit      Unprioritized   Anemia    Repeat CBC with labs today.      Relevant Orders   CBC   Community acquired pneumonia - Primary    Reviewed hospitalization with pt and his two daughters. He is doing well and symptoms improving. Will continue Cefuroxime and Doxycycline. Follow up if any worsening cough, fever or other concerns. Labs today including CBC and BMP. Follow up in 2 weeks with plan to repeat CXR.      Relevant Orders   CBC   Basic Metabolic Panel (BMET)       Return in about 2 weeks (around 07/19/2015) for Recheck.

## 2015-07-05 NOTE — Assessment & Plan Note (Signed)
Repeat CBC with labs today. 

## 2015-07-05 NOTE — Progress Notes (Signed)
Pre visit review using our clinic review tool, if applicable. No additional management support is needed unless otherwise documented below in the visit note. 

## 2015-07-06 ENCOUNTER — Telehealth: Payer: Self-pay | Admitting: Internal Medicine

## 2015-07-06 NOTE — Telephone Encounter (Signed)
[name] returned my call, gave verbal per Dr. Dan HumphreysWalker for PT.

## 2015-07-06 NOTE — Telephone Encounter (Signed)
Left Message for her to return my call.

## 2015-07-06 NOTE — Telephone Encounter (Signed)
Aislinn called from [facility] regarding needing a verbal order for Home health PT twice a week for four weeks. Aislinn number is [phone]. Thank You!

## 2015-07-08 ENCOUNTER — Telehealth: Payer: Self-pay | Admitting: Internal Medicine

## 2015-07-08 NOTE — Telephone Encounter (Signed)
Please leave both appt as is

## 2015-07-08 NOTE — Telephone Encounter (Signed)
I was checking Dr Dan HumphreysWalker schedule for the upcoming week and noticed this pt is scheduled for a 15 min appt. And pt is also scheduled on 08/01/2015. Can you tell me where to schedule pt for a 30 min length once spoken to Dr Dan HumphreysWalker. Or does the pt need to have the on 11/3? Let me know. Thank You!

## 2015-07-11 ENCOUNTER — Telehealth: Payer: Self-pay | Admitting: Internal Medicine

## 2015-07-11 NOTE — Telephone Encounter (Signed)
[facility] advise pt fell this morning hitting the back of his head. The fall was witnessed by pt son. Pt son didn't want to take pt to the ED or Urgent Care. Pt and son where both advised by [facility]Dawn of the sign and symptoms of head trauma. Son will watch pt for the evening.msn

## 2015-07-11 NOTE — Telephone Encounter (Signed)
OK. If any persistent headache, change in mental status, nausea or vomiting, then he needs ER evaluation.

## 2015-07-11 NOTE — Telephone Encounter (Signed)
Please advise 

## 2015-07-14 ENCOUNTER — Ambulatory Visit (INDEPENDENT_AMBULATORY_CARE_PROVIDER_SITE_OTHER): Payer: Medicare Other | Admitting: Podiatry

## 2015-07-14 ENCOUNTER — Ambulatory Visit (INDEPENDENT_AMBULATORY_CARE_PROVIDER_SITE_OTHER): Payer: Medicare Other | Admitting: Internal Medicine

## 2015-07-14 ENCOUNTER — Encounter: Payer: Self-pay | Admitting: Internal Medicine

## 2015-07-14 VITALS — BP 147/62 | HR 75 | Temp 97.7°F | Ht 63.0 in | Wt 145.4 lb

## 2015-07-14 DIAGNOSIS — M79676 Pain in unspecified toe(s): Secondary | ICD-10-CM | POA: Diagnosis not present

## 2015-07-14 DIAGNOSIS — N183 Chronic kidney disease, stage 3 unspecified: Secondary | ICD-10-CM

## 2015-07-14 DIAGNOSIS — D649 Anemia, unspecified: Secondary | ICD-10-CM | POA: Diagnosis not present

## 2015-07-14 DIAGNOSIS — J189 Pneumonia, unspecified organism: Secondary | ICD-10-CM

## 2015-07-14 DIAGNOSIS — B351 Tinea unguium: Secondary | ICD-10-CM | POA: Diagnosis not present

## 2015-07-14 NOTE — Patient Instructions (Addendum)
Follow up 4 weeks.  Plan for chest xray and labs at follow up.

## 2015-07-14 NOTE — Assessment & Plan Note (Signed)
Recent renal function stable. Plan repeat in 4 weeks.

## 2015-07-14 NOTE — Progress Notes (Signed)
Subjective:    Patient ID: Pedro PlumberJohnie Allen [name], male    DOB: 07/08/1911, [age] y.o.   MRN: [id number]  HPI  [age]YO male presents for follow up.  Recently hospitalized for CAP. Followed up 1 week ago. Labs showed improvement in anemia and stable renal function.  Continues to be weak, however improving, per daughter. Completed antibiotic. Coughs a few times per day. No fever. No dyspnea. Appetite is good.  Breakfast - eats 2 eggs with bread Lunch - Soup and half sandwich Supper - Vegetable and Meat Snacks on peanuts or pudding. Has occasional looser stools, but this is normal for him. Uses an OTC vegetable laxative daily because of constipation.   Wt Readings from Last 3 Encounters:  07/14/15 145 lb 6 oz (65.942 kg)  07/05/15 149 lb 4 oz (67.699 kg)  06/29/15 151 lb 4.8 oz (68.629 kg)   BP Readings from Last 3 Encounters:  07/14/15 147/62  07/05/15 143/61  07/02/15 166/62    Past Medical History  Diagnosis Date  . Chronic kidney disease   . Syncope and collapse June 2012    pre syncopal  . Hypertension    Family History  Problem Relation Age of Onset  . Hypertension Other   . Diabetes Neg Hx    Past Surgical History  Procedure Laterality Date  . Prostate surgery    . Intraocular lens implant, secondary  1990    bilateral implant  . Gallbladder surgery    . Hernia repair    . Kidney stone surgery     Social History   Social History  . Marital Status: Married    Spouse Name: N/A  . Number of Children: N/A  . Years of Education: N/A   Social History Main Topics  . Smoking status: Former Smoker -- 1.00 packs/day for 20 years    Types: Cigarettes    Quit date: 10/01/1949  . Smokeless tobacco: Never Used  . Alcohol Use: No  . Drug Use: No  . Sexual Activity: Not Asked   Other Topics Concern  . None   Social History Narrative    Review of Systems  Constitutional: Negative for fever, chills, activity change, appetite change, fatigue and unexpected  weight change.  Eyes: Negative for visual disturbance.  Respiratory: Positive for cough. Negative for shortness of breath.   Cardiovascular: Negative for chest pain, palpitations and leg swelling.  Gastrointestinal: Positive for diarrhea and constipation. Negative for nausea, vomiting, abdominal pain and abdominal distention.  Genitourinary: Negative for dysuria, urgency and difficulty urinating.  Musculoskeletal: Positive for gait problem (chronic). Negative for arthralgias.  Skin: Negative for color change and rash.  Hematological: Negative for adenopathy.  Psychiatric/Behavioral: Negative for sleep disturbance and dysphoric mood. The patient is not nervous/anxious.        Objective:    BP 147/62 mmHg  Pulse 75  Temp(Src) 97.7 F (36.5 C) (Oral)  Ht 5\' 3"  (1.6 m)  Wt 145 lb 6 oz (65.942 kg)  BMI 25.76 kg/m2  SpO2 100% Physical Exam  Constitutional: He is oriented to person, place, and time. He appears well-developed and well-nourished. No distress.  HENT:  Head: Normocephalic and atraumatic.  Right Ear: External ear normal.  Left Ear: External ear normal.  Nose: Nose normal.  Mouth/Throat: Oropharynx is clear and moist.  Eyes: Conjunctivae and EOM are normal. Pupils are equal, round, and reactive to light. Right eye exhibits no discharge. Left eye exhibits no discharge. No scleral icterus.  Neck: Normal range of motion. Neck  supple. No tracheal deviation present. No thyromegaly present.  Cardiovascular: Normal rate, regular rhythm and normal heart sounds.  Exam reveals no gallop and no friction rub.   No murmur heard. Pulmonary/Chest: Effort normal and breath sounds normal. No accessory muscle usage. No tachypnea. No respiratory distress. He has no decreased breath sounds. He has no wheezes. He has no rhonchi. He has no rales. He exhibits no tenderness.  Musculoskeletal: Normal range of motion. He exhibits no edema.  Lymphadenopathy:    He has no cervical adenopathy.    Neurological: He is alert and oriented to person, place, and time. No cranial nerve deficit. Coordination normal.  Skin: Skin is warm and dry. No rash noted. He is not diaphoretic. No erythema. No pallor.  Psychiatric: He has a normal mood and affect. His behavior is normal. Judgment and thought content normal.          Assessment & Plan:   Problem List Items Addressed This Visit      Unprioritized   Anemia    Labs showed improvement in Hgb. Plan repeat CBC in 4 weeks.      Community acquired pneumonia - Primary    Symptoms improving. Completed antibiotics. Repeat CXR in 4 weeks. Follow up 4 weeks and prn.      CRI (chronic renal insufficiency)    Recent renal function stable. Plan repeat in 4 weeks.          Return in about 4 weeks (around 08/11/2015) for Recheck.

## 2015-07-14 NOTE — Progress Notes (Signed)
Subjective: [age] y.o. returns the office today for painful, elongated, thickened toenails which he is unable to do himself. Denies any redness or drainage around the nails. Denies any acute changes since last appointment and no new complaints today. Denies any systemic complaints such as fevers, chills, nausea, vomiting.   Objective: AAO 3, NAD DP/PT pulses palpable, CRT less than 3 seconds Nails hypertrophic, dystrophic, elongated, brittle, discolored 10. There is tenderness overlying the nails 1-5 bilaterally. There is no surrounding erythema or drainage along the nail sites. Hyperkeratotic lesion right medial hallux. Upon debridement there is no underlying ulceration, drainage or other signs of infection. No open lesions or otherpre-ulcerative lesions are identified. No other areas of tenderness bilateral lower extremities. No overlying edema, erythema, increased warmth.   No pain with calf compression, swelling, warmth, erythema.  Assessment: Patient presents with symptomatic onychomycosis  Plan: -Treatment options including alternatives, risks, complications were discussed -Nails sharply debrided 10 without complication/bleeding. -Hyperkeratotic lesion debrided without complication/bleeding. -Discussed daily foot inspection. If there are any changes, to call the office immediately.  -Follow-up in 3 months or sooner if any problems are to arise. In the meantime, encouraged to call the office with any questions, concerns, changes symptoms.  Ovid CurdMatthew Jaziel Bennett, DPM

## 2015-07-14 NOTE — Assessment & Plan Note (Signed)
Symptoms improving. Completed antibiotics. Repeat CXR in 4 weeks. Follow up 4 weeks and prn.

## 2015-07-14 NOTE — Assessment & Plan Note (Signed)
Labs showed improvement in Hgb. Plan repeat CBC in 4 weeks.

## 2015-07-14 NOTE — Progress Notes (Signed)
Pre visit review using our clinic review tool, if applicable. No additional management support is needed unless otherwise documented below in the visit note. 

## 2015-07-21 ENCOUNTER — Encounter: Payer: Self-pay | Admitting: Cardiovascular Disease

## 2015-07-21 ENCOUNTER — Ambulatory Visit (INDEPENDENT_AMBULATORY_CARE_PROVIDER_SITE_OTHER): Payer: Medicare Other | Admitting: Cardiovascular Disease

## 2015-07-21 VITALS — BP 143/69 | HR 77 | Ht 63.0 in | Wt 145.8 lb

## 2015-07-21 DIAGNOSIS — N183 Chronic kidney disease, stage 3 unspecified: Secondary | ICD-10-CM

## 2015-07-21 DIAGNOSIS — R600 Localized edema: Secondary | ICD-10-CM

## 2015-07-21 DIAGNOSIS — R011 Cardiac murmur, unspecified: Secondary | ICD-10-CM | POA: Diagnosis not present

## 2015-07-21 DIAGNOSIS — I6523 Occlusion and stenosis of bilateral carotid arteries: Secondary | ICD-10-CM

## 2015-07-21 DIAGNOSIS — I272 Other secondary pulmonary hypertension: Secondary | ICD-10-CM

## 2015-07-21 DIAGNOSIS — I159 Secondary hypertension, unspecified: Secondary | ICD-10-CM | POA: Diagnosis not present

## 2015-07-21 DIAGNOSIS — R531 Weakness: Secondary | ICD-10-CM

## 2015-07-21 DIAGNOSIS — J189 Pneumonia, unspecified organism: Secondary | ICD-10-CM

## 2015-07-21 NOTE — Assessment & Plan Note (Signed)
No significant carotid bruit appreciated No further workup at this time He does not want a statin

## 2015-07-21 NOTE — Patient Instructions (Signed)
You are doing well. No medication changes were made.  Please call us if you have new issues that need to be addressed before your next appt.  Your physician wants you to follow-up in: 12 months.  You will receive a reminder letter in the mail two months in advance. If you don't receive a letter, please call our office to schedule the follow-up appointment. 

## 2015-07-21 NOTE — Assessment & Plan Note (Signed)
Seems to have recovered from his pneumonia. Lungs are clear, no cough

## 2015-07-21 NOTE — Assessment & Plan Note (Signed)
Aortic valve sclerosis, possibly mild stenosis

## 2015-07-21 NOTE — Assessment & Plan Note (Signed)
Leg edema likely from venous insufficiency, predominantly on the left Unable to exclude component of diastolic CHF

## 2015-07-21 NOTE — Assessment & Plan Note (Signed)
Significant swelling on the left lower extremity, minimal on the right He does not take Lasix. Denies any shortness of breath Relatively stable at this time

## 2015-07-21 NOTE — Assessment & Plan Note (Signed)
Creatinine stable, greater than 2

## 2015-07-21 NOTE — Progress Notes (Signed)
Patient ID: [name], male    DOB: 1910-12-06, 81 y.o.   MRN: [id number]  HPI Comments: [name] is a 81 year old gentleman with a history of chronic hypertension, renal dysfunction, who presented to the hospital on March 06 2012 with bradycardia, severe hypertension. Cardiology was consulted to manage his blood pressure. He was discharged on hydralazine t.i.d. Creatinine is 2.2 over the past several years, stable Moderate bilateral carotid disease in late 2013 He does not want a statin   In follow-up today, he presents with his son Recently in the emergency room for malaise 3 weeks ago, x-ray suggesting pneumonia  he was given ABX, feels better  Reports having a fall recently, slipped on the ground, possibly on his urine   still with incontinence  Blood work from the hospital shows creatinine 2.17, hematocrit 29  EKG on today's visit shows normal sinus rhythm with rate 77 bpm, left anterior fascicular block  Other past medical history  Periodic incontinence, insomnia.  Previous prostate issues and has polyuria at night time with urine retention.  this continues to be a issue  Prior issues with swelling of his penis Chronic leg swelling, worse on the left leg. Takes Lasix when necessary. in general this has been stable  Does not like to use a cane or walker. He is requiring significant help from his family during the daytime. [name] several days ago.    prior accident with a saw. Ultrasound in the hospital 05/18/2012 showed no DVT.  Previous Echo showed normal systolic function, EF >55%, mild to moderately elevated right ventricular systolic pressures, diastolic dysfunction  Allergies  Allergen Reactions  . Amoxicillin Other (See Comments)    Reaction:  Unknown   . Clarithromycin Other (See Comments)    Reaction:  Unknown   . Influenza Vaccines Other (See Comments)    Reaction:  Unknown   . Levofloxacin Other (See Comments)    Reaction:  Unknown   . Macrodantin  Other (See Comments)    Reaction:  Unknown   . Metronidazole Other (See Comments)    Reaction:  Unknown   . Micardis [Telmisartan] Other (See Comments)    Reaction:  Unknown   . Nabumetone Other (See Comments)    Reaction:  Unknown   . Neosporin [Neomycin-Polymyxin-Gramicidin] Other (See Comments)    Reaction:  Unknown   . Penicillins Other (See Comments)    Reaction:  Unknown   . Sulfa Drugs Cross Reactors Other (See Comments)    Reaction:  Unknown   . Zithromax [Azithromycin] Diarrhea and Other (See Comments)    Reaction:  Dizziness and headaches    Current Outpatient Prescriptions on File Prior to Visit  Medication Sig Dispense Refill  . acidophilus (RISAQUAD) CAPS capsule Take 1 capsule by mouth daily.    . ferrous sulfate 325 (65 FE) MG EC tablet Take 325 mg by mouth daily.    Marland Kitchen glucosamine-chondroitin 500-400 MG tablet Take 1 tablet by mouth 3 (three) times daily.    Marland Kitchen guaiFENesin (MUCINEX) 600 MG 12 hr tablet Take 1 tablet (600 mg total) by mouth 2 (two) times daily. 30 tablet 0  . hydrALAZINE (APRESOLINE) 25 MG tablet Take 25 mg by mouth 4 (four) times daily.    . multivitamin-lutein (OCUVITE-LUTEIN) CAPS capsule Take 1 capsule by mouth daily.    Marland Kitchen nystatin-triamcinolone ointment (MYCOLOG) Apply 1 application topically 2 (two) times daily as needed (for bed sores/ulcers).    Marland Kitchen omeprazole (PRILOSEC) 20 MG capsule Take 20 mg  by mouth daily.     No current facility-administered medications on file prior to visit.    Past Medical History  Diagnosis Date  . Chronic kidney disease   . Syncope and collapse June 2012    pre syncopal  . Hypertension     Past Surgical History  Procedure Laterality Date  . Prostate surgery    . Intraocular lens implant, secondary  1990    bilateral implant  . Gallbladder surgery    . Hernia repair    . Kidney stone surgery      Social History  reports that he quit smoking about 65 years ago. His smoking use included Cigarettes. He has  a 20 pack-year smoking history. He has never used smokeless tobacco. He reports that he does not drink alcohol or use illicit drugs.  Family History family history includes Hypertension in his other. There is no history of Diabetes.     Review of Systems  Constitutional: Negative.   Respiratory: Negative.   Cardiovascular: Positive for leg swelling.  Gastrointestinal: Negative.   Genitourinary: Positive for difficulty urinating.  Musculoskeletal: Positive for gait problem.  Neurological: Negative.   Hematological: Negative.   Psychiatric/Behavioral: Negative.   All other systems reviewed and are negative.  BP 143/69 mmHg  Pulse 77  Ht 5\' 3"  (1.6 m)  Wt 145 lb 12 oz (66.112 kg)  BMI 25.83 kg/m2  Physical Exam  Constitutional: He is oriented to person, place, and time. He appears well-developed and well-nourished.  Elderly gentleman   HENT:  Head: Normocephalic.  Nose: Nose normal.  Mouth/Throat: Oropharynx is clear and moist.  Eyes: Conjunctivae are normal. Pupils are equal, round, and reactive to light.  Neck: Normal range of motion. Neck supple. No JVD present. Carotid bruit is present.  Cardiovascular: Normal rate, regular rhythm, S1 normal, S2 normal and intact distal pulses.  Exam reveals no gallop and no friction rub.   Murmur heard.  Crescendo systolic murmur is present with a grade of 2/6  1+ pitting edema of the left lower extremity to below the knee  Pulmonary/Chest: Effort normal and breath sounds normal. No respiratory distress. He has no wheezes. He has no rales. He exhibits no tenderness.  Abdominal: Soft. Bowel sounds are normal. He exhibits no distension. There is no tenderness.  Musculoskeletal: Normal range of motion. He exhibits no edema or tenderness.  Lymphadenopathy:    He has no cervical adenopathy.  Neurological: He is alert and oriented to person, place, and time. Coordination normal.  Skin: Skin is warm and dry. No rash noted. No erythema.   Psychiatric: He has a normal mood and affect. His behavior is normal. Judgment and thought content normal.      Assessment and Plan   Nursing note and vitals reviewed.

## 2015-07-21 NOTE — Assessment & Plan Note (Signed)
Blood pressure is well controlled on today's visit. No changes made to the medications. 

## 2015-08-01 ENCOUNTER — Ambulatory Visit: Payer: Medicare Other | Admitting: Internal Medicine

## 2015-08-15 ENCOUNTER — Ambulatory Visit (INDEPENDENT_AMBULATORY_CARE_PROVIDER_SITE_OTHER): Payer: Medicare Other | Admitting: Internal Medicine

## 2015-08-15 ENCOUNTER — Encounter: Payer: Self-pay | Admitting: Internal Medicine

## 2015-08-15 ENCOUNTER — Ambulatory Visit
Admission: RE | Admit: 2015-08-15 | Discharge: 2015-08-15 | Disposition: A | Discharge status: Home / Self Care | Payer: Medicare Other | Source: Ambulatory Visit | Attending: Internal Medicine | Admitting: Internal Medicine

## 2015-08-15 VITALS — BP 146/56 | HR 77 | Temp 97.8°F | Ht 63.0 in | Wt 150.4 lb

## 2015-08-15 DIAGNOSIS — D649 Anemia, unspecified: Secondary | ICD-10-CM | POA: Diagnosis not present

## 2015-08-15 DIAGNOSIS — J189 Pneumonia, unspecified organism: Secondary | ICD-10-CM | POA: Diagnosis not present

## 2015-08-15 DIAGNOSIS — Z09 Encounter for follow-up examination after completed treatment for conditions other than malignant neoplasm: Secondary | ICD-10-CM | POA: Insufficient documentation

## 2015-08-15 DIAGNOSIS — R918 Other nonspecific abnormal finding of lung field: Secondary | ICD-10-CM | POA: Insufficient documentation

## 2015-08-15 NOTE — Progress Notes (Signed)
Pre visit review using our clinic review tool, if applicable. No additional management support is needed unless otherwise documented below in the visit note. 

## 2015-08-15 NOTE — Patient Instructions (Signed)
Chest xray today.  Follow up in 3 months and sooner as needed.

## 2015-08-15 NOTE — Assessment & Plan Note (Signed)
Recent hgb was stable. ACD. Repeat CBC in 3 months.

## 2015-08-15 NOTE — Assessment & Plan Note (Signed)
Exam is normal today. Cough has improved. Will repeat CXR for follow up of left perihilar infiltrate. Follow up in 3 months and prn.

## 2015-08-15 NOTE — Progress Notes (Signed)
Subjective:    Patient ID: [name], male    DOB: 05/15/1911, [age] y.o.   MRN: [id number]  HPI  [name] male presents for follow up.  Doing well. Released by PT. Cough has improved. Appetite good. Energy level good. Continues to have 24/7 care by his daughters. No concerns today.  Wt Readings from Last 3 Encounters:  08/15/15 150 lb 6 oz (68.21 kg)  07/21/15 145 lb 12 oz (66.112 kg)  07/14/15 145 lb 6 oz (65.942 kg)   BP Readings from Last 3 Encounters:  08/15/15 146/56  07/21/15 143/69  07/14/15 147/62    Past Medical History  Diagnosis Date  . Chronic kidney disease   . Syncope and collapse June 2012    pre syncopal  . Hypertension    Family History  Problem Relation Age of Onset  . Hypertension Other   . Diabetes Neg Hx    Past Surgical History  Procedure Laterality Date  . Prostate surgery    . Intraocular lens implant, secondary  1990    bilateral implant  . Gallbladder surgery    . Hernia repair    . Kidney stone surgery     Social History   Social History  . Marital Status: Married    Spouse Name: [name]  . Number of Children: [name]  . Years of Education: [name]   Social History Main Topics  . Smoking status: Former Smoker -- 1.00 packs/day for 20 years    Types: Cigarettes    Quit date: 10/01/1949  . Smokeless tobacco: Never Used  . Alcohol Use: No  . Drug Use: No  . Sexual Activity: Not Asked   Other Topics Concern  . None   Social History Narrative    Review of Systems  Constitutional: Negative for fever, chills, activity change, appetite change, fatigue and unexpected weight change.  Eyes: Negative for visual disturbance.  Respiratory: Positive for cough. Negative for chest tightness, shortness of breath and wheezing.   Cardiovascular: Negative for chest pain, palpitations and leg swelling.  Gastrointestinal: Negative for abdominal pain and abdominal distention.  Genitourinary: Negative for dysuria, urgency and difficulty  urinating.  Musculoskeletal: Negative for arthralgias and gait problem.  Skin: Negative for color change and rash.  Hematological: Negative for adenopathy.  Psychiatric/Behavioral: Negative for sleep disturbance and dysphoric mood. The patient is not nervous/anxious.        Objective:    BP 146/56 mmHg  Pulse 77  Temp(Src) 97.8 F (36.6 C) (Oral)  Ht 5\' 3"  (1.6 m)  Wt 150 lb 6 oz (68.21 kg)  BMI 26.64 kg/m2  SpO2 98% Physical Exam  Constitutional: He is oriented to person, place, and time. He appears well-developed and well-nourished. No distress.  HENT:  Head: Normocephalic and atraumatic.  Right Ear: External ear normal.  Left Ear: External ear normal.  Nose: Nose normal.  Mouth/Throat: Oropharynx is clear and moist.  Eyes: Conjunctivae and EOM are normal. Pupils are equal, round, and reactive to light. Right eye exhibits no discharge. Left eye exhibits no discharge. No scleral icterus.  Neck: Normal range of motion. Neck supple. No tracheal deviation present. No thyromegaly present.  Cardiovascular: Normal rate, regular rhythm and normal heart sounds.  Exam reveals no gallop and no friction rub.   No murmur heard. Pulmonary/Chest: Effort normal and breath sounds normal. No accessory muscle usage. No tachypnea. No respiratory distress. He has no decreased breath sounds. He has no wheezes. He has no rhonchi. He has no rales. He  exhibits no tenderness.  Musculoskeletal: Normal range of motion. He exhibits no edema.  Lymphadenopathy:    He has no cervical adenopathy.  Neurological: He is alert and oriented to person, place, and time. No cranial nerve deficit. Coordination normal.  Skin: Skin is warm and dry. No rash noted. He is not diaphoretic. No erythema. No pallor.  Psychiatric: He has a normal mood and affect. His behavior is normal. Judgment and thought content normal.          Assessment & Plan:   Problem List Items Addressed This Visit      Unprioritized    Anemia    Recent hgb was stable. ACD. Repeat CBC in 3 months.      Community acquired pneumonia - Primary    Exam is normal today. Cough has improved. Will repeat CXR for follow up of left perihilar infiltrate. Follow up in 3 months and prn.      Relevant Orders   DG Chest 2 View       Return in about 3 months (around 11/13/2015) for Recheck.

## 2015-08-26 ENCOUNTER — Other Ambulatory Visit: Payer: Self-pay | Admitting: Internal Medicine

## 2015-09-07 ENCOUNTER — Telehealth: Payer: Self-pay | Admitting: *Deleted

## 2015-09-07 ENCOUNTER — Ambulatory Visit: Payer: Medicare Other | Admitting: Surgery

## 2015-09-07 NOTE — Telephone Encounter (Signed)
Home health wanted to FYI Dr. Dan HumphreysWalker that Patient had a face to face with Dr. Renae GlossWieting while hospitalized in 07-01-15.

## 2015-09-07 NOTE — Telephone Encounter (Signed)
FYI

## 2015-09-08 ENCOUNTER — Encounter: Payer: Medicare Other | Attending: Surgery | Admitting: General Surgery

## 2015-09-08 ENCOUNTER — Ambulatory Visit: Payer: Medicare Other | Admitting: General Surgery

## 2015-09-08 ENCOUNTER — Encounter: Payer: Self-pay | Admitting: General Surgery

## 2015-09-08 DIAGNOSIS — Z880 Allergy status to penicillin: Secondary | ICD-10-CM | POA: Diagnosis not present

## 2015-09-08 DIAGNOSIS — L89151 Pressure ulcer of sacral region, stage 1: Secondary | ICD-10-CM | POA: Insufficient documentation

## 2015-09-08 DIAGNOSIS — M1990 Unspecified osteoarthritis, unspecified site: Secondary | ICD-10-CM | POA: Diagnosis not present

## 2015-09-08 DIAGNOSIS — L89153 Pressure ulcer of sacral region, stage 3: Secondary | ICD-10-CM | POA: Insufficient documentation

## 2015-09-08 DIAGNOSIS — I10 Essential (primary) hypertension: Secondary | ICD-10-CM | POA: Diagnosis not present

## 2015-09-08 NOTE — Progress Notes (Signed)
seeiheal 

## 2015-09-09 ENCOUNTER — Ambulatory Visit: Payer: Medicare Other | Admitting: General Surgery

## 2015-09-09 NOTE — Progress Notes (Addendum)
[name], JEWEL ([id number]) Visit Report for 09/08/2015 Physician Orders Details Patient Name: [name], Pedro A. Date of Service: 09/08/2015 3:15 PM Medical Record Number: [id number] Patient Account Number: [id number] Date of Birth/Sex: 1911/02/05 (81 y.o. Male) Treating RN: Leonard Downing Primary Care Physician: Ronna Polio Other Clinician: Referring Physician: Ronna Polio Treating Physician/Extender: Elayne Snare in Treatment: 0 Verbal / Phone Orders: Yes Clinician: Leonard Downing Read Back and Verified: No Diagnosis Coding Discharge From [facility] o Discharge from Wound Care Center - consult Electronic Signature(s) Signed: 09/13/2015 12:07:48 PM By: Maureen Chatters Signed: 09/13/2015 4:48:12 PM By: Ardath Sax MD Signed: 09/13/2015 5:08:02 PM By: Elpidio Eric BSN, RN Previous Signature: 09/08/2015 4:17:44 PM Version By: Maureen Chatters Previous Signature: 09/09/2015 8:01:58 AM Version By: Ardath Sax MD Entered By: Elpidio Eric on 09/13/2015 12:07:48 [name] ([id number]) -------------------------------------------------------------------------------- Progress Note Details Patient Name: [name] A. Date of Service: 09/08/2015 3:15 PM Medical Record Number: [id number] Patient Account Number: [id number] Date of Birth/Sex: 12/25/1910 (81 y.o. Male) Treating RN: Leonard Downing Primary Care Physician: Ronna Polio Other Clinician: Referring Physician: Ronna Polio Treating Physician/Extender: Ardath Sax Weeks in Treatment: 0 Subjective Wound History Patient presents with 3 open wounds that have been present for approximately a while. Patient has been treating wounds in the following manner: open to air. Laboratory tests have been performed in the last month. Patient reportedly has not tested positive for an antibiotic resistant organism. Patient reportedly has not tested positive for osteomyelitis. Patient reportedly has  not had testing performed to evaluate circulation in the legs. Patient History Information obtained from Patient, Chart. Allergies amoxicillin (Reaction: unknown), clarithromycin (Reaction: unknown), influenza vaccines (Reaction: unknown), levofloxacin (Reaction: unknown), Macrodantin (Reaction: unknown), metronidazole (Reaction: unknown), Micardis (Reaction: unknown), nabumetone (Reaction: unknown), Neosporin (neo-bac-polym) (Reaction: unknown), Penicillins (Reaction: unknown), sulfa drugs cross reactors (Reaction: unknown), Zithromax (Reaction: dizziness and headaches) Family History Diabetes, Heart Disease, Hypertension - Father, No family history of Cancer, Hereditary Spherocytosis, Kidney Disease, Lung Disease, Seizures, Stroke, Thyroid Problems, Tuberculosis. Social History Never smoker, Marital Status - Married, Alcohol Use - Never, Drug Use - No History, Caffeine Use - Daily. Medical History Eyes Denies history of Cataracts, Glaucoma, Optic Neuritis Ear/Nose/Mouth/Throat Denies history of Chronic sinus problems/congestion, Middle ear problems Hematologic/Lymphatic Patient has history of Anemia Respiratory Denies history of Aspiration, Asthma, Chronic Obstructive Pulmonary Disease (COPD), Sleep Apnea, Tuberculosis Cardiovascular Patient has history of Arrhythmia, Hypertension Gastrointestinal [name], [name] ([id number]) Denies history of Cirrhosis , Colitis, Crohn s, Hepatitis A, Hepatitis B, Hepatitis C Endocrine Denies history of Type I Diabetes, Type II Diabetes Genitourinary Denies history of End Stage Renal Disease Immunological Denies history of Lupus Erythematosus, Raynaud s, Scleroderma Integumentary (Skin) Patient has history of History of pressure wounds Denies history of History of Burn Musculoskeletal Patient has history of Osteoarthritis Neurologic Denies history of Dementia, Neuropathy, Quadriplegia, Paraplegia, Seizure Disorder Oncologic Denies  history of Received Chemotherapy, Received Radiation Psychiatric Denies history of Anorexia/bulimia, Confinement Anxiety Review of Systems (ROS) Eyes The patient has no complaints or symptoms. Ear/Nose/Mouth/Throat The patient has no complaints or symptoms. Hematologic/Lymphatic The patient has no complaints or symptoms. Respiratory The patient has no complaints or symptoms. Cardiovascular The patient has no complaints or symptoms. Gastrointestinal The patient has no complaints or symptoms. Endocrine The patient has no complaints or symptoms. Genitourinary Complains or has symptoms of Incontinence/dribbling. Immunological The patient has no complaints or symptoms. Integumentary (Skin) Complains or has symptoms of Wounds, Breakdown. Neurologic The patient has no complaints or symptoms. Oncologic The  patient has no complaints or symptoms. Psychiatric The patient has no complaints or symptoms. Pedro CageCATES, Pedro A. ([id number]) Objective Constitutional Vitals Time Taken: 3:01 PM, Height: 60 in, Source: Stated, Weight: 150 lbs, Source: Stated, BMI: 29.3, Temperature: 97.7 F, Pulse: 71 bpm, Respiratory Rate: 16 breaths/min, Blood Pressure: 162/60 mmHg, Pulse Oximetry: 100 %. Integumentary (Hair, Skin) Wound #1 status is Healed - Epithelialized. Original cause of wound was Pressure Injury. The wound is located on the Left Ilium. The wound measures 0cm length x 0cm width x 0cm depth; 0cm^2 area and 0cm^3 volume. There is no tunneling or undermining noted. There is a none present amount of drainage noted. The wound margin is flat and intact. There is no granulation within the wound bed. There is no necrotic tissue within the wound bed. The periwound skin appearance exhibited: Erythema. The surrounding wound skin color is noted with erythema which is circumferential. The periwound has tenderness on palpation. Wound #2 status is Healed - Epithelialized. Original cause of wound was  Pressure Injury. The wound is located on the Right Ilium. The wound measures 0cm length x 0cm width x 0cm depth; 0cm^2 area and 0cm^3 volume. There is no tunneling or undermining noted. There is a none present amount of drainage noted. The wound margin is flat and intact. There is no granulation within the wound bed. There is no necrotic tissue within the wound bed. The periwound skin appearance exhibited: Erythema. The surrounding wound skin color is noted with erythema which is circumferential. Wound #3 status is Healed - Epithelialized. Original cause of wound was Pressure Injury. The wound is located on the Right Sacrum. The wound measures 0cm length x 0cm width x 0cm depth; 0cm^2 area and 0cm^3 volume. The wound is limited to skin breakdown. There is no tunneling or undermining noted. There is a none present amount of drainage noted. The wound margin is distinct with the outline attached to the wound base. There is no granulation within the wound bed. There is no necrotic tissue within the wound bed. The periwound skin appearance exhibited: Dry/Scaly. The periwound skin appearance did not exhibit: Callus, Crepitus, Excoriation, Fluctuance, Friable, Induration, Localized Edema, Rash, Scarring, Maceration, Moist, Atrophie Blanche, Cyanosis, Ecchymosis, Hemosiderin Staining, Mottled, Pallor, Rubor, Erythema. Periwound temperature was noted as No Abnormality. Wound #3 status is Healed - Epithelialized. Original cause of wound was Pressure Injury. The wound is located on the Right Sacrum. The wound measures 0cm length x 0cm width x 0cm depth; 0cm^2 area and 0cm^3 volume. There is no tunneling or undermining noted. There is a none present amount of drainage noted. There is no granulation within the wound bed. There is no necrotic tissue within the wound bed. The periwound skin appearance exhibited: Erythema. The surrounding wound skin color is noted with erythema which is circumferential. The  periwound has tenderness on palpation. Assessment Mowbray, Pedro A. ([id number]) Pressure wound right sacrum stage 1. Applied bordered foam dressing. Pressure wound right ischium stage 1. Apllied bordered foam dressing. Pressure wound left ischium stage 1. Applied bordered foam dressing. Discharged as consult. [facility] - consult Follow-Up Appointments: A follow-up appointment should be scheduled. Medication Reconciliation completed and provided to Patient/Care Provider. Electronic Signature(s) Signed: 09/13/2015 4:48:01 PM By: Ardath SaxParker, Zia Kanner MD Previous Signature: 09/09/2015 10:47:02 AM Version By: Ardath SaxParker, Greenlee Ancheta MD Entered By: Ardath SaxParker, Chalese Peach on 09/13/2015 16:48:01 [name], Pedro MercyJOHNIE A. ([id number]) -------------------------------------------------------------------------------- ROS/PFSH Details Patient Name: Pedro CageATES, Ryley A. Date of Service: 09/08/2015 3:15 PM Medical Record Number:  [id number] Patient Account Number: [id number] Date of Birth/Sex: 1910/09/14 (81 y.o. Male) Treating RN: Clover Mealy, RN, BSN, Jerome Sink Primary Care Physician: Ronna Polio Other Clinician: Referring Physician: Ronna Polio Treating Physician/Extender: Elayne Snare in Treatment: 0 Information Obtained From Patient Chart Wound History Do you currently have one or more open woundso Yes How many open wounds do you currently haveo 3 Approximately how long have you had your woundso a while How have you been treating your wound(s) until nowo open to air Has your wound(s) ever healed and then re-openedo No Have you tested positive for an antibiotic resistant organism (MRSA, VRE)o No Have you tested positive for osteomyelitis (bone infection)o No Have you had any tests for circulation on your legso No Genitourinary Complaints and Symptoms: Positive for: Incontinence/dribbling Medical History: Negative for: End Stage Renal Disease Integumentary  (Skin) Complaints and Symptoms: Positive for: Wounds; Breakdown Medical History: Positive for: History of pressure wounds Negative for: History of Burn Eyes Complaints and Symptoms: No Complaints or Symptoms Medical History: Negative for: Cataracts; Glaucoma; Optic Neuritis Ear/Nose/Mouth/Throat Complaints and Symptoms: No Complaints or Symptoms Rigg, Pedro A. ([id number]) Medical History: Negative for: Chronic sinus problems/congestion; Middle ear problems Hematologic/Lymphatic Complaints and Symptoms: No Complaints or Symptoms Medical History: Positive for: Anemia Respiratory Complaints and Symptoms: No Complaints or Symptoms Medical History: Negative for: Aspiration; Asthma; Chronic Obstructive Pulmonary Disease (COPD); Sleep Apnea; Tuberculosis Cardiovascular Complaints and Symptoms: No Complaints or Symptoms Medical History: Positive for: Arrhythmia; Hypertension Gastrointestinal Complaints and Symptoms: No Complaints or Symptoms Medical History: Negative for: Cirrhosis ; Colitis; Crohnos; Hepatitis A; Hepatitis B; Hepatitis C Endocrine Complaints and Symptoms: No Complaints or Symptoms Medical History: Negative for: Type I Diabetes; Type II Diabetes Immunological Complaints and Symptoms: No Complaints or Symptoms Medical History: Negative for: Lupus Erythematosus; Raynaudos; Scleroderma Hoon, Pedro A. ([id number]) Musculoskeletal Medical History: Positive for: Osteoarthritis Neurologic Complaints and Symptoms: No Complaints or Symptoms Medical History: Negative for: Dementia; Neuropathy; Quadriplegia; Paraplegia; Seizure Disorder Oncologic Complaints and Symptoms: No Complaints or Symptoms Medical History: Negative for: Received Chemotherapy; Received Radiation Psychiatric Complaints and Symptoms: No Complaints or Symptoms Medical History: Negative for: Anorexia/bulimia; Confinement Anxiety Family and Social History Cancer: No; Diabetes:  Yes; Heart Disease: Yes; Hereditary Spherocytosis: No; Hypertension: Yes - Father; Kidney Disease: No; Lung Disease: No; Seizures: No; Stroke: No; Thyroid Problems: No; Tuberculosis: No; Never smoker; Marital Status - Married; Alcohol Use: Never; Drug Use: No History; Caffeine Use: Daily; Financial Concerns: No; Food, Clothing or Shelter Needs: No; Support System Lacking: No; Transportation Concerns: No; Advanced Directives: No; Living Will: No Electronic Signature(s) Signed: 09/08/2015 3:28:31 PM By: Elpidio Eric BSN, RN Signed: 09/09/2015 8:01:58 AM By: Ardath Sax MD Entered By: Elpidio Eric on 09/08/2015 15:28:00 [name], [name] ([id number]) -------------------------------------------------------------------------------- SuperBill Details Patient Name: [name] A. Date of Service: 09/08/2015 Medical Record Number: [id number] Patient Account Number: [id number] Date of Birth/Sex: [birth year] (81 y.o. Male) Treating RN: Leonard Downing Primary Care Physician: Ronna Polio Other Clinician: Referring Physician: Ronna Polio Treating Physician/Extender: Elayne Snare in Treatment: 0 Diagnosis Coding ICD-10 Codes Code Description [phone] Pressure ulcer of sacral region, stage 3 Facility Procedures CPT4 Code: [id number] Description: [phone] - WOUND CARE VISIT-LEV 5 EST PT Modifier: Quantity: 1 Physician Procedures CPT4 Code: [id number] Description: [id number] - WC PHYS LEVEL 2 - EST PT ICD-10 Description Diagnosis L89.153 Pressure ulcer of sacral region, stage 3 Modifier: Quantity: 1 Electronic Signature(s) Signed: 09/09/2015 10:48:21 AM By: Ardath Sax MD Previous Signature: 09/08/2015 4:17:44 PM Version By: Lucrezia Starch RN, Rosalio Macadamia Previous Signature:  09/09/2015 8:01:58 AM Version By: Ardath Sax MD Entered By: Ardath Sax on 09/09/2015 10:48:21

## 2015-09-09 NOTE — Progress Notes (Signed)
CHEICK, SUHR ([id number]) Visit Report for 09/08/2015 Abuse/Suicide Risk Screen Details Patient Name: [name], Pedro A. Date of Service: 09/08/2015 3:15 PM Medical Record Number: [id number] Patient Account Number: [id number] Date of Birth/Sex: [birth year] (81 y.o. Male) Treating RN: Leonard Downing Primary Care Physician: Ronna Polio Other Clinician: Referring Physician: Ronna Polio Treating Physician/Extender: Elayne Snare in Treatment: 0 Abuse/Suicide Risk Screen Items Answer ABUSE/SUICIDE RISK SCREEN: Has anyone close to you tried to hurt or harm you recentlyo No Do you feel uncomfortable with anyone in your familyo No Has anyone forced you do things that you didnot want to doo No Do you have any thoughts of harming yourselfo No Patient displays signs or symptoms of abuse and/or neglect. No Electronic Signature(s) Signed: 09/08/2015 4:17:44 PM By: Lucrezia Starch RN, Sendra Entered By: Lucrezia Starch RN, Sendra on 09/08/2015 15:04:36 Gavigan, Pedro Mercy ([id number]) -------------------------------------------------------------------------------- Activities of Daily Living Details Patient Name: Pedro Cage A. Date of Service: 09/08/2015 3:15 PM Medical Record Number: [id number] Patient Account Number: [id number] Date of Birth/Sex: [birth year] (81 y.o. Male) Treating RN: Leonard Downing Primary Care Physician: Ronna Polio Other Clinician: Referring Physician: Ronna Polio Treating Physician/Extender: Elayne Snare in Treatment: 0 Activities of Daily Living Items Answer Activities of Daily Living (Please select one for each item) Drive Automobile Not Able Take Medications Need Assistance Use Telephone Need Assistance Care for Appearance Need Assistance Use Toilet Need Assistance Bath / Shower Need Assistance Dress Self Need Assistance Feed Self Completely Able Walk Need Assistance Get In / Out Bed Completely Able Housework Need Assistance Prepare  Meals Not Able Handle Money Need Assistance Shop for Self Need Assistance Electronic Signature(s) Signed: 09/08/2015 4:17:44 PM By: Lucrezia Starch, RN, Sendra Entered By: Lucrezia Starch RN, Sendra on 09/08/2015 15:05:07 Ryland, Pedro Mercy ([id number]) -------------------------------------------------------------------------------- Education Assessment Details Patient Name: Pedro Jun. Date of Service: 09/08/2015 3:15 PM Medical Record Number: [id number] Patient Account Number: [id number] Date of Birth/Sex: June 03, 1911 (81 y.o. Male) Treating RN: Leonard Downing Primary Care Physician: Ronna Polio Other Clinician: Referring Physician: Ronna Polio Treating Physician/Extender: Elayne Snare in Treatment: 0 Learning Preferences/Education Level/Primary Language Learning Preference: Explanation, Demonstration Preferred Language: English Cognitive Barrier Assessment/Beliefs Language Barrier: No Translator Needed: No Memory Deficit: No Emotional Barrier: No Cultural/Religious Beliefs Affecting Medical No Care: Physical Barrier Assessment Impaired Vision: Yes Glasses, lens implant Impaired Hearing: Yes HOH Decreased Hand dexterity: No Knowledge/Comprehension Assessment Knowledge Level: Low Comprehension Level: Low Ability to understand written Medium instructions: Ability to understand verbal Medium instructions: Motivation Assessment Anxiety Level: Calm Cooperation: Cooperative Education Importance: Acknowledges Need Interest in Health Problems: Asks Questions Perception: Coherent Willingness to Engage in Self- Medium Management Activities: Readiness to Engage in Self- Medium Management Activities: Electronic Signature(s) Signed: 09/08/2015 4:17:44 PM By: Lucrezia Starch, RN, Elvera Lennox, Pedro Mercy ([id number]) Entered By: Lucrezia Starch RN, Sendra on 09/08/2015 15:06:06 Shifflett, Pedro Mercy  ([id number]) -------------------------------------------------------------------------------- Fall Risk Assessment Details Patient Name: Pedro Jun. Date of Service: 09/08/2015 3:15 PM Medical Record Number: [id number] Patient Account Number: [id number] Date of Birth/Sex: 1911-02-08 (81 y.o. Male) Treating RN: Leonard Downing Primary Care Physician: Ronna Polio Other Clinician: Referring Physician: Ronna Polio Treating Physician/Extender: Elayne Snare in Treatment: 0 Fall Risk Assessment Items Have you had 2 or more falls in the last 12 monthso 0 Yes Have you had any fall that resulted in injury in the last 12 monthso 0 Yes FALL RISK ASSESSMENT: History of falling - immediate or within 3 months 25 Yes Secondary diagnosis 15 Yes Ambulatory aid None/bed rest/wheelchair/nurse 0 Yes Crutches/cane/walker 15 Yes Furniture  0 No IV Access/Saline Lock 0 No Gait/Training Normal/bed rest/immobile 0 No Weak 10 Yes Impaired 0 No Mental Status Oriented to own ability 0 Yes Electronic Signature(s) Signed: 09/08/2015 4:17:44 PM By: Lucrezia Starch[place], RN, Sendra Entered By: Lucrezia [place]arch[place], RN, Sendra on 09/08/2015 15:06:31 Pucillo, Pedro MercyJOHNIE A. ([id number]) -------------------------------------------------------------------------------- Foot Assessment Details Patient Name: [name], Pedro A. Date of Service: 09/08/2015 3:15 PM Medical Record Number: [id number] Patient Account Number: [id number] Date of Birth/Sex: 12/10/1910 (81 y.o. Male) Treating RN: Leonard Downing[place], Sendra Primary Care Physician: Ronna PolioWalker, Jennifer Other Clinician: Referring Physician: Ronna PolioWalker, Jennifer Treating Physician/Extender: Elayne SnarePARKER, PETER Weeks in Treatment: 0 Foot Assessment Items Site Locations + = Sensation present, - = Sensation absent, C = Callus, U = Ulcer R = Redness, W = Warmth, M = Maceration, PU = Pre-ulcerative lesion F = Fissure, S = Swelling, D = Dryness Assessment Right: Left: Other Deformity: No  No Prior Foot Ulcer: No No Prior Amputation: No No Charcot Joint: No No Ambulatory Status: Ambulatory With Help Assistance Device: Walker Gait: Unsteady Notes assessment not necessary to visit Electronic Signature(s) Signed: 09/08/2015 4:17:44 PM By: Lucrezia Starch[place], RN, Sendra Entered By: Lucrezia Starch[place], RN, Sendra on 09/08/2015 15:07:22 Joe, Pedro MercyJOHNIE A. ([id number]) Girardin, Pedro MercyJOHNIE A. ([id number]) -------------------------------------------------------------------------------- Nutrition Risk Assessment Details Patient Name: [name], Pedro A. Date of Service: 09/08/2015 3:15 PM Medical Record Number: [id number] Patient Account Number: [id number] Date of Birth/Sex: 11/04/1910 (81 y.o. Male) Treating RN: Leonard Downing[place], Sendra Primary Care Physician: Ronna PolioWalker, Jennifer Other Clinician: Referring Physician: Ronna PolioWalker, Jennifer Treating Physician/Extender: Elayne SnarePARKER, PETER Weeks in Treatment: 0 Height (in): 60 Weight (lbs): 150 Body Mass Index (BMI): 29.3 Nutrition Risk Assessment Items NUTRITION RISK SCREEN: I have an illness or condition that made me change the kind and/or 0 No amount of food I eat I eat fewer than two meals per day 0 No I eat few fruits and vegetables, or milk products 0 No I have three or more drinks of beer, liquor or wine almost every day 0 No I have tooth or mouth problems that make it hard for me to eat 0 No I don't always have enough money to buy the food I need 0 No I eat alone most of the time 0 No I take three or more different prescribed or over-the-counter drugs a 1 Yes day Without wanting to, I have lost or gained 10 pounds in the last six 0 No months I am not always physically able to shop, cook and/or feed myself 2 Yes Nutrition Protocols Good Risk Protocol Provide education on Moderate Risk Protocol 0 nutrition Electronic Signature(s) Signed: 09/08/2015 4:17:44 PM By: Lucrezia Starch[place], RN, Sendra Entered By: Lucrezia Starch[place], RN, Sendra on 09/08/2015 15:06:48

## 2015-09-09 NOTE — Progress Notes (Addendum)
[name], Daxten A. ([id number]) Visit Report for 09/08/2015 Allergy List Details Patient Name: Pedro CageCATES, Pedro A. Date of Service: 09/08/2015 3:15 PM Medical Record Number: [id number] Patient Account Number: [id number] Date of Birth/Sex: 03/24/1911 (81 y.o. Male) Treating RN: Clover MealyAfful, RN, BSN, Curtiss Sinkita Primary Care Physician: Ronna PolioWalker, Jennifer Other Clinician: Referring Physician: Ronna PolioWalker, Jennifer Treating Physician/Extender: Elayne SnarePARKER, PETER Weeks in Treatment: 0 Allergies Active Allergies amoxicillin Reaction: unknown clarithromycin Reaction: unknown influenza vaccines Reaction: unknown levofloxacin Reaction: unknown Macrodantin Reaction: unknown metronidazole Reaction: unknown Micardis Reaction: unknown nabumetone Reaction: unknown Neosporin (neo-bac-polym) Reaction: unknown Penicillins Reaction: unknown sulfa drugs cross reactors Reaction: unknown Fullen, Damarrion A. ([id number]) Zithromax Reaction: dizziness and headaches Allergy Notes Electronic Signature(s) Signed: 09/08/2015 3:28:31 PM By: Elpidio EricAfful, Rita BSN, RN Entered By: Elpidio EricAfful, Rita on 09/08/2015 15:04:04 Azeez, Lorin MercyJOHNIE A. ([id number]) -------------------------------------------------------------------------------- Arrival Information Details Patient Name: Pedro JunATES, Pedro A. Date of Service: 09/08/2015 3:15 PM Medical Record Number: [id number] Patient Account Number: [id number] Date of Birth/Sex: 10/11/1910 (81 y.o. Male) Treating RN: Leonard Downingoseboro, Sendra Primary Care Physician: Ronna PolioWalker, Jennifer Other Clinician: Referring Physician: Ronna PolioWalker, Jennifer Treating Physician/Extender: Elayne SnarePARKER, PETER Weeks in Treatment: 0 Visit Information Patient Arrived: Cloyde ReamsWalker Arrival Time: 15:00 Accompanied By: daughter Transfer Assistance: None Patient Identification Verified: Yes Secondary Verification Process Yes Completed: Electronic Signature(s) Signed: 09/08/2015 4:17:44 PM By: Lucrezia Starchoseboro, RN, Sendra Entered By: Lucrezia Starchoseboro, RN, Sendra on  09/08/2015 15:01:08 Cercone, Lorin MercyJOHNIE A. ([id number]) -------------------------------------------------------------------------------- Clinic Level of Care Assessment Details Patient Name: Pedro JunATES, Pedro A. Date of Service: 09/08/2015 3:15 PM Medical Record Number: [id number] Patient Account Number: [id number] Date of Birth/Sex: 11/14/1910 (81 y.o. Male) Treating RN: Leonard Downingoseboro, Sendra Primary Care Physician: Ronna PolioWalker, Jennifer Other Clinician: Referring Physician: Ronna PolioWalker, Jennifer Treating Physician/Extender: Elayne SnarePARKER, PETER Weeks in Treatment: 0 Clinic Level of Care Assessment Items TOOL 2 Quantity Score X - Use when only an EandM is performed on the INITIAL visit 1 0 ASSESSMENTS - Nursing Assessment / Reassessment X - General Physical Exam (combine w/ comprehensive assessment (listed just 1 20 below) when performed on new pt. evals) X - Comprehensive Assessment (HX, ROS, Risk Assessments, Wounds Hx, etc.) 1 25 ASSESSMENTS - Wound and Skin Assessment / Reassessment []  - Simple Wound Assessment / Reassessment - one wound 0 X - Complex Wound Assessment / Reassessment - multiple wounds 3 5 []  - Dermatologic / Skin Assessment (not related to wound area) 0 ASSESSMENTS - Ostomy and/or Continence Assessment and Care []  - Incontinence Assessment and Management 0 []  - Ostomy Care Assessment and Management (repouching, etc.) 0 PROCESS - Coordination of Care X - Simple Patient / Family Education for ongoing care 1 15 []  - Complex (extensive) Patient / Family Education for ongoing care 0 X - Staff obtains ChiropractorConsents, Records, Test Results / Process Orders 1 10 X - Staff telephones HHA, Nursing Homes / Clarify orders / etc 1 10 []  - Routine Transfer to another Facility (non-emergent condition) 0 []  - Routine Hospital Admission (non-emergent condition) 0 []  - New Admissions / Manufacturing engineernsurance Authorizations / Ordering NPWT, Apligraf, etc. 0 []  - Emergency Hospital Admission (emergent condition) 0 X - Simple  Discharge Coordination 1 10 Mailloux, Jerauld A. ([id number]) []  - Complex (extensive) Discharge Coordination 0 PROCESS - Special Needs []  - Pediatric / Minor Patient Management 0 []  - Isolation Patient Management 0 []  - Hearing / Language / Visual special needs 0 []  - Assessment of Community assistance (transportation, D/C planning, etc.) 0 []  - Additional assistance / Altered mentation 0 []  - Support Surface(s) Assessment (bed, cushion, seat, etc.) 0 INTERVENTIONS - Wound Cleansing / Measurement  X - Wound Imaging (photographs - any number of wounds) 1 5  - Wound Tracing (instead of photographs) 0  - Simple Wound Measurement - one wound 0 X - Complex Wound Measurement - multiple wounds 3 5  - Simple Wound Cleansing - one wound 0 X - Complex Wound Cleansing - multiple wounds 3 5 INTERVENTIONS - Wound Dressings X - Small Wound Dressing one or multiple wounds 3 10  - Medium Wound Dressing one or multiple wounds 0  - Large Wound Dressing one or multiple wounds 0  - Application of Medications - injection 0 INTERVENTIONS - Miscellaneous  - External ear exam 0  - Specimen Collection (cultures, biopsies, blood, body fluids, etc.) 0  - Specimen(s) / Culture(s) sent or taken to Lab for analysis 0  - Patient Transfer (multiple staff / Michiel Sites Lift / Similar devices) 0  - Simple Staple / Suture removal (25 or less) 0  - Complex Staple / Suture removal (26 or more) 0 Rispoli, Bradshaw A. ([id number])  - Hypo / Hyperglycemic Management (close monitor of Blood Glucose) 0  - Ankle / Brachial Index (ABI) - do not check if billed separately 0 Has the patient been seen at the hospital within the last three years: Yes Total Score: 170 Level Of Care: New/Established - Level 5 Electronic Signature(s) Signed: 09/08/2015 4:17:44 PM By: Lucrezia Starch, RN, Sendra Entered By: Lucrezia Starch RN, Sendra on 09/08/2015 15:45:38 Belmonte, Lorin Mercy  ([id number]) -------------------------------------------------------------------------------- Encounter Discharge Information Details Patient Name: [name] A. Date of Service: 09/08/2015 3:15 PM Medical Record Number: [id number] Patient Account Number: [id number] Date of Birth/Sex: 1910/10/20 (81 y.o. Male) Treating RN: Leonard Downing Primary Care Physician: Ronna Polio Other Clinician: Referring Physician: Ronna Polio Treating Physician/Extender: Elayne Snare in Treatment: 0 Encounter Discharge Information Items Discharge Pain Level: 10 Discharge Condition: Stable Ambulatory Status: Walker Discharge Destination: Home Private Transportation: Auto Accompanied By: family Schedule Follow-up Appointment: Yes Medication Reconciliation completed and Yes provided to Patient/Care Gratia Disla: Clinical Summary of Care: Electronic Signature(s) Signed: 09/09/2015 10:49:17 AM By: Ardath Sax MD Previous Signature: 09/08/2015 4:17:44 PM Version By: Lucrezia Starch RN, Sendra Entered By: Ardath Sax on 09/09/2015 10:49:17 Wulf, Lorin Mercy ([id number]) -------------------------------------------------------------------------------- Lower Extremity Assessment Details Patient Name: Pedro Jun. Date of Service: 09/08/2015 3:15 PM Medical Record Number: [id number] Patient Account Number: [id number] Date of Birth/Sex: Sep 16, 1910 (81 y.o. Male) Treating RN: Leonard Downing Primary Care Physician: Ronna Polio Other Clinician: Referring Physician: Ronna Polio Treating Physician/Extender: Ardath Sax Weeks in Treatment: 0 Electronic Signature(s) Signed: 09/08/2015 4:17:44 PM By: Lucrezia Starch RN, Sendra Entered By: Lucrezia Starch RN, Sendra on 09/08/2015 15:07:36 Mikels, Lorin Mercy ([id number]) -------------------------------------------------------------------------------- Multi Wound Chart Details Patient Name: [name] A. Date of Service: 09/08/2015 3:15  PM Medical Record Number: [id number] Patient Account Number: [id number] Date of Birth/Sex: [birth year] (81 y.o. Male) Treating RN: Leonard Downing Primary Care Physician: Ronna Polio Other Clinician: Referring Physician: Ronna Polio Treating Physician/Extender: Elayne Snare in Treatment: 0 Vital Signs Height(in): 60 Pulse(bpm): 71 Weight(lbs): 150 Blood Pressure 162/60 (mmHg): Body Mass Index(BMI): 29 Temperature(F): 97.7 Respiratory Rate 16 (breaths/min): Photos: [1:No Photos] [2:No Photos] [N/A:N/A] Wound Location: [1:Left Ilium] [2:Right Ilium] [N/A:N/A] Wounding Event: [1:Pressure Injury] [2:Pressure Injury] [N/A:N/A] Primary Etiology: [1:Pressure Ulcer] [2:Pressure Ulcer] [N/A:N/A] Date Acquired: [1:08/10/2014] [2:08/10/2014] [N/A:N/A] Weeks of Treatment: [1:0] [2:0] [N/A:N/A] Wound Status: [1:Open] [2:Open] [N/A:N/A] Measurements L x W x D 3.5x2.5x0 [2:3x3.5x0] [N/A:N/A] (cm) Area (cm) : [1:6.872] [2:8.247] [N/A:N/A] Volume (cm) : [1:0.687] [2:0.825] [N/A:N/A] % Reduction in Area: [1:0.00%] [2:N/A] [N/A:N/A] % Reduction in  Volume: 0.00% [2:N/A] [N/A:N/A] Classification: [1:Category/Stage I] [2:Category/Stage I] [N/A:N/A] Exudate Amount: [1:None Present] [2:None Present] [N/A:N/A] Wound Margin: [1:Flat and Intact] [2:Flat and Intact] [N/A:N/A] Granulation Amount: [1:None Present (0%)] [2:None Present (0%)] [N/A:N/A] Necrotic Amount: [1:None Present (0%)] [2:None Present (0%)] [N/A:N/A] Epithelialization: [1:None] [2:None] [N/A:N/A] Periwound Skin Texture: No Abnormalities Noted [2:No Abnormalities Noted] [N/A:N/A] Periwound Skin [1:No Abnormalities Noted] [2:No Abnormalities Noted] [N/A:N/A] Moisture: Periwound Skin Color: Erythema: Yes [2:Erythema: Yes] [N/A:N/A] Erythema Location: [1:Circumferential] [2:Circumferential] [N/A:N/A] Tenderness on [1:Yes] [2:No] [N/A:N/A] Palpation: Wound Preparation: [1:Ulcer Cleansing: Rinsed/Irrigated with  Saline] [2:Ulcer Cleansing: Rinsed/Irrigated with Saline] [N/A:N/A] Topical Anesthetic Topical Anesthetic Applied: None Applied: None Treatment Notes Electronic Signature(s) Signed: 09/08/2015 4:17:44 PM By: Lucrezia Starch, RN, Sendra Entered By: Lucrezia Starch RN, Sendra on 09/08/2015 15:24:43 Crall, Lorin Mercy ([id number]) -------------------------------------------------------------------------------- Multi-Disciplinary Care Plan Details Patient Name: Pedro Jun. Date of Service: 09/08/2015 3:15 PM Medical Record Number: [id number] Patient Account Number: [id number] Date of Birth/Sex: 07-Feb-1911 (81 y.o. Male) Treating RN: Leonard Downing Primary Care Physician: Ronna Polio Other Clinician: Referring Physician: Ronna Polio Treating Physician/Extender: Ardath Sax Weeks in Treatment: 0 Active Inactive Electronic Signature(s) Signed: 09/13/2015 12:07:11 PM By: Maureen Chatters Signed: 09/13/2015 5:08:02 PM By: Elpidio Eric BSN, RN Previous Signature: 09/09/2015 2:20:00 PM Version By: Curtis Sites Previous Signature: 09/08/2015 4:17:44 PM Version By: Lucrezia Starch RN, Sendra Entered By: Elpidio Eric on 09/13/2015 12:07:11 Bethards, Lorin Mercy ([id number]) -------------------------------------------------------------------------------- Pain Assessment Details Patient Name: [name] A. Date of Service: 09/08/2015 3:15 PM Medical Record Number: [id number] Patient Account Number: [id number] Date of Birth/Sex: 1911-06-30 (81 y.o. Male) Treating RN: Leonard Downing Primary Care Physician: Ronna Polio Other Clinician: Referring Physician: Ronna Polio Treating Physician/Extender: Elayne Snare in Treatment: 0 Active Problems Location of Pain Severity and Description of Pain Patient Has Paino Yes Site Locations Rate the pain. Current Pain Level: 10 Pain Management and Medication Current Pain Management: Electronic Signature(s) Signed: 09/08/2015 4:17:44 PM By:  Lucrezia Starch, RN, Sendra Entered By: Lucrezia Starch RN, Sendra on 09/08/2015 15:01:37 Miceli, Lorin Mercy ([id number]) -------------------------------------------------------------------------------- Patient/Caregiver Education Details Patient Name: Pedro Jun. Date of Service: 09/08/2015 3:15 PM Medical Record Number: [id number] Patient Account Number: [id number] Date of Birth/Gender: May 22, 1911 (81 y.o. Male) Treating RN: Leonard Downing Primary Care Physician: Ronna Polio Other Clinician: Referring Physician: Ronna Polio Treating Physician/Extender: Elayne Snare in Treatment: 0 Education Assessment Education Provided To: Patient and Caregiver daughters Education Topics Provided Pressure: Handouts: Pressure Ulcers: Care and Offloading, Preventing Pressure Ulcers Methods: Explain/Verbal Responses: State content correctly [facility]: Handouts: [facility] Methods: Explain/Verbal Responses: State content correctly Electronic Signature(s) Signed: 09/09/2015 10:49:27 AM By: Ardath Sax MD Previous Signature: 09/08/2015 4:17:44 PM Version By: Lucrezia Starch RN, Sendra Entered By: Ardath Sax on 09/09/2015 10:49:26 Bankson, Lorin Mercy ([id number]) -------------------------------------------------------------------------------- Wound Assessment Details Patient Name: [name] A. Date of Service: 09/08/2015 3:15 PM Medical Record Number: [id number] Patient Account Number: [id number] Date of Birth/Sex: 1911-04-03 (81 y.o. Male) Treating RN: Clover Mealy, RN, BSN, Wol[place] Sink Primary Care Physician: Ronna Polio Other Clinician: Referring Physician: Ronna Polio Treating Physician/Extender: Ardath Sax Weeks in Treatment: 0 Wound Status Wound Number: 1 Primary Etiology: Pressure Ulcer Wound Location: Left Ilium Wound Status: Healed - Epithelialized Wounding Event: Pressure Injury Date Acquired: 08/10/2014 Weeks Of Treatment:  0 Clustered Wound: No Photos Photo Uploaded By: Lucrezia Starch, RN, Rosalio Macadamia on 09/08/2015 15:31:39 Wound Measurements Length: (cm) 0 % Reduction in Width: (cm) 0 % Reduction in Depth: (cm) 0 Epithelializat Area: (cm) 0 Tunneling: Volume: (cm) 0 Undermining: Area:  100% Volume: 100% ion: None No No Wound Description Classification: Category/Stage I Foul Odor Aft Wound Margin: Flat and Intact Exudate Amount: None Present er Cleansing: No Wound Bed Granulation Amount: None Present (0%) Necrotic Amount: None Present (0%) Periwound Skin Texture Texture Color No Abnormalities Noted: No No Abnormalities Noted: No Erythema: Yes Moisture Gabbard, Rockwell A. ([id number]) No Abnormalities Noted: No Erythema Location: Circumferential Temperature / Pain Tenderness on Palpation: Yes Wound Preparation Ulcer Cleansing: Rinsed/Irrigated with Saline Topical Anesthetic Applied: None Treatment Notes Wound #1 (Left Ilium) 1. Cleansed with: Clean wound with Normal Saline 5. Secondary Dressing Applied Bordered Foam Dressing Electronic Signature(s) Signed: 09/13/2015 5:08:02 PM By: Elpidio Eric BSN, RN Previous Signature: 09/08/2015 4:17:44 PM Version By: Lucrezia Starch RN, Sendra Entered By: Elpidio Eric on 09/13/2015 12:06:24 Klimowicz, Lorin Mercy ([id number]) -------------------------------------------------------------------------------- Wound Assessment Details Patient Name: [name] A. Date of Service: 09/08/2015 3:15 PM Medical Record Number: [id number] Patient Account Number: [id number] Date of Birth/Sex: 1911/07/16 (81 y.o. Male) Treating RN: Clover Mealy, RN, BSN, Rita Primary Care Physician: Ronna Polio Other Clinician: Referring Physician: Ronna Polio Treating Physician/Extender: Ardath Sax Weeks in Treatment: 0 Wound Status Wound Number: 2 Primary Etiology: Pressure Ulcer Wound Location: Right Ilium Wound Status: Healed - Epithelialized Wounding Event: Pressure Injury Date  Acquired: 08/10/2014 Weeks Of Treatment: 0 Clustered Wound: No Photos Photo Uploaded By: Lucrezia Starch, RN, Rosalio Macadamia on 09/08/2015 15:31:40 Wound Measurements Length: (cm) 0 % Reduction in Width: (cm) 0 % Reduction in Depth: (cm) 0 Epithelializat Area: (cm) 0 Tunneling: Volume: (cm) 0 Undermining: Area: 100% Volume: 100% ion: None No No Wound Description Classification: Category/Stage I Foul Odor Aft Wound Margin: Flat and Intact Exudate Amount: None Present er Cleansing: No Wound Bed Granulation Amount: None Present (0%) Necrotic Amount: None Present (0%) Periwound Skin Texture Texture Color No Abnormalities Noted: No No Abnormalities Noted: No Erythema: Yes Moisture Alers, Mikle A. ([id number]) No Abnormalities Noted: No Erythema Location: Circumferential Wound Preparation Ulcer Cleansing: Rinsed/Irrigated with Saline Topical Anesthetic Applied: None Treatment Notes Wound #2 (Right Ilium) 1. Cleansed with: Clean wound with Normal Saline 5. Secondary Dressing Applied Bordered Foam Dressing Electronic Signature(s) Signed: 09/13/2015 5:08:02 PM By: Elpidio Eric BSN, RN Previous Signature: 09/08/2015 4:17:44 PM Version By: Lucrezia Starch RN, Sendra Entered By: Elpidio Eric on 09/13/2015 12:06:25 Cuervo, Lorin Mercy ([id number]) -------------------------------------------------------------------------------- Wound Assessment Details Patient Name: [name] A. Date of Service: 09/08/2015 3:15 PM Medical Record Number: [id number] Patient Account Number: [id number] Date of Birth/Sex: 1911-01-17 (81 y.o. Male) Treating RN: Clover Mealy, RN, BSN, Rita Primary Care Physician: Ronna Polio Other Clinician: Referring Physician: Ronna Polio Treating Physician/Extender: Elayne Snare in Treatment: 0 Wound Status Wound Number: 3 Primary Pressure Ulcer Etiology: Wound Location: Right Sacrum Wound Healed - Epithelialized Wounding Event: Pressure Injury Status: Date  Acquired: 08/10/2014 Comorbid Anemia, Arrhythmia, Hypertension, Weeks Of Treatment: 0 History: History of pressure wounds, Clustered Wound: No Osteoarthritis Photos Photo Uploaded By: Lucrezia Starch, RN, Rosalio Macadamia on 09/08/2015 15:36:02 Wound Measurements Length: (cm) 0 % Reduction Width: (cm) 0 % Reduction Depth: (cm) 0 Epitheliali Area: (cm) 0 Tunneling: Volume: (cm) 0 Underminin in Area: 100% in Volume: 100% zation: None No g: No Wound Description Classification: Category/Stage I Wound Margin: Distinct, outline attached Exudate Amount: None Present Gaugh, Coady A. ([id number]) Foul Odor After Cleansing: No Wound Bed Granulation Amount: None Present (0%) Exposed Structure Necrotic Amount: None Present (0%) Fascia Exposed: No Fat Layer Exposed: No Tendon Exposed: No Muscle Exposed: No Joint Exposed: No Bone Exposed: No Limited to Skin Breakdown Periwound Skin Texture Texture Color  No Abnormalities Noted: No No Abnormalities Noted: No Callus: No Atrophie Blanche: No Crepitus: No Cyanosis: No Excoriation: No Ecchymosis: No Fluctuance: No Erythema: No Friable: No Hemosiderin Staining: No Induration: No Mottled: No Localized Edema: No Pallor: No Rash: No Rubor: No Scarring: No Temperature / Pain Moisture Temperature: No Abnormality No Abnormalities Noted: No Dry / Scaly: Yes Maceration: No Moist: No Wound Preparation Ulcer Cleansing: Rinsed/Irrigated with Saline Treatment Notes Wound #3 (Right Sacrum) 1. Cleansed with: Clean wound with Normal Saline 5. Secondary Dressing Applied Bordered Foam Dressing Electronic Signature(s) Signed: 09/13/2015 5:08:02 PM By: Elpidio Eric BSN, RN Previous Signature: 09/08/2015 4:17:44 PM Version By: Lucrezia Starch RN, Sendra Entered By: Elpidio Eric on 09/13/2015 12:06:25 Fanfan, Lorin Mercy ([id number]) -------------------------------------------------------------------------------- Wound Assessment Details Patient Name: [name] A. Date of Service: 09/08/2015 3:15 PM Medical Record Number: [id number] Patient Account Number: [id number] Date of Birth/Sex: 1911/03/21 (81 y.o. Male) Treating RN: Clover Mealy, RN, BSN, Rita Primary Care Physician: Ronna Polio Other Clinician: Referring Physician: Ronna Polio Treating Physician/Extender: Elayne Snare in Treatment: 0 Wound Status Wound Number: 3 Primary Etiology: Pressure Ulcer Wound Location: Right Sacrum Wound Status: Healed - Epithelialized Wounding Event: Pressure Injury Date Acquired: 08/10/2014 Weeks Of Treatment: 0 Clustered Wound: No Wound Measurements Length: (cm) 0 % Reduction Width: (cm) 0 % Reduction Depth: (cm) 0 Epitheliali Area: (cm) 0 Tunneling: Volume: (cm) 0 Underminin in Area: 100% in Volume: 100% zation: None No g: No Wound Description Classification: Category/Stage I Exudate Amount: None Present Foul Odor After Cleansing: No Wound Bed Granulation Amount: None Present (0%) Necrotic Amount: None Present (0%) Periwound Skin Texture Texture Color No Abnormalities Noted: No No Abnormalities Noted: No Erythema: Yes Moisture Erythema Location: Circumferential No Abnormalities Noted: No Temperature / Pain Tenderness on Palpation: Yes Wound Preparation Ulcer Cleansing: Rinsed/Irrigated with Saline Topical Anesthetic Applied: None Treatment Notes Wound #3 (Right Sacrum) 1. Cleansed with: Clean wound with Normal Saline Tedeschi, Sanford A. ([id number]) 5. Secondary Dressing Applied Bordered Foam Dressing Electronic Signature(s) Signed: 09/13/2015 5:08:02 PM By: Elpidio Eric BSN, RN Previous Signature: 09/08/2015 4:17:44 PM Version By: Lucrezia Starch RN, Sendra Entered By: Elpidio Eric on 09/13/2015 12:06:26 Padron, Lorin Mercy ([id number]) -------------------------------------------------------------------------------- Vitals Details Patient Name: [name] A. Date of Service: 09/08/2015 3:15 PM Medical Record Number:  [id number] Patient Account Number: [id number] Date of Birth/Sex: August 21, 1911 (81 y.o. Male) Treating RN: Leonard Downing Primary Care Physician: Ronna Polio Other Clinician: Referring Physician: Ronna Polio Treating Physician/Extender: Elayne Snare in Treatment: 0 Vital Signs Time Taken: 15:01 Temperature (F): 97.7 Height (in): 60 Pulse (bpm): 71 Source: Stated Respiratory Rate (breaths/min): 16 Weight (lbs): 150 Blood Pressure (mmHg): 162/60 Source: Stated Reference Range: 80 - 120 mg / dl Body Mass Index (BMI): 29.3 Pulse Oximetry (%): 100 Electronic Signature(s) Signed: 09/08/2015 4:17:44 PM By: Lucrezia Starch RN, Sendra Entered By: Lucrezia Starch RN, Sendra on 09/08/2015 15:04:23

## 2015-10-13 ENCOUNTER — Encounter: Payer: Self-pay | Admitting: Podiatry

## 2015-10-13 ENCOUNTER — Ambulatory Visit: Payer: Medicare Other

## 2015-10-13 ENCOUNTER — Ambulatory Visit (INDEPENDENT_AMBULATORY_CARE_PROVIDER_SITE_OTHER): Payer: Medicare Other | Admitting: Podiatry

## 2015-10-13 DIAGNOSIS — B351 Tinea unguium: Secondary | ICD-10-CM | POA: Diagnosis not present

## 2015-10-13 DIAGNOSIS — M79676 Pain in unspecified toe(s): Secondary | ICD-10-CM | POA: Diagnosis not present

## 2015-10-13 NOTE — Progress Notes (Signed)
Patient ID: [name], male   DOB: 1911/01/07, 82 y.o.   MRN: [id number]  Subjective: 82 y.o. returns the office today for painful, elongated, thickened toenails which he is unable to do himself. Denies any redness or drainage around the nails. Denies any acute changes since last appointment and no new complaints today. Denies any systemic complaints such as fevers, chills, nausea, vomiting.   Objective: AAO 3, NAD; presents with son DP/PT pulses palpable, CRT less than 3 seconds Nails hypertrophic, dystrophic, elongated, brittle, discolored 10. There is tenderness overlying the nails 1-5 bilaterally. There is no surrounding erythema or drainage along the nail sites. Hyperkeratotic lesion right medial hallux. Upon debridement there is no underlying ulceration, drainage or other signs of infection. No open lesions or otherpre-ulcerative lesions are identified. No other areas of tenderness bilateral lower extremities. No overlying edema, erythema, increased warmth.   No pain with calf compression, swelling, warmth, erythema.  Assessment: Patient presents with symptomatic onychomycosis  Plan: -Treatment options including alternatives, risks, complications were discussed -Nails sharply debrided 10 without complication/bleeding. -Hyperkeratotic lesion debrided without complication/bleeding. -Discussed daily foot inspection. If there are any changes, to call the office immediately.  -Follow-up in 3 months or sooner if any problems are to arise. In the meantime, encouraged to call the office with any questions, concerns, changes symptoms.  Ovid Curd, DPM

## 2015-10-19 ENCOUNTER — Ambulatory Visit (INDEPENDENT_AMBULATORY_CARE_PROVIDER_SITE_OTHER): Payer: Medicare Other | Admitting: Internal Medicine

## 2015-10-19 ENCOUNTER — Ambulatory Visit
Admission: RE | Admit: 2015-10-19 | Discharge: 2015-10-19 | Disposition: A | Discharge status: Home / Self Care | Payer: Medicare Other | Source: Ambulatory Visit | Attending: Internal Medicine | Admitting: Internal Medicine

## 2015-10-19 ENCOUNTER — Encounter: Payer: Self-pay | Admitting: Internal Medicine

## 2015-10-19 VITALS — BP 156/66 | HR 92 | Temp 98.3°F | Ht 63.0 in | Wt 152.2 lb

## 2015-10-19 DIAGNOSIS — R918 Other nonspecific abnormal finding of lung field: Secondary | ICD-10-CM | POA: Diagnosis not present

## 2015-10-19 DIAGNOSIS — R05 Cough: Secondary | ICD-10-CM | POA: Insufficient documentation

## 2015-10-19 DIAGNOSIS — R059 Cough, unspecified: Secondary | ICD-10-CM | POA: Insufficient documentation

## 2015-10-19 MED ORDER — DOXYCYCLINE HYCLATE 100 MG PO TABS: 100.0000 mg | ORAL_TABLET | Freq: Two times a day (BID) | ORAL | Status: DC

## 2015-10-19 NOTE — Progress Notes (Signed)
Subjective:    Patient ID: [name], male    DOB: September 25, 1910, 82 y.o.   MRN: [id number]  HPI  82YO male presents for acute visit.  Cough - Cough productive of sputum that is white in color over the last week. No fever. No dyspnea. Seems weaker to his daughter. Not taking anything for symptoms.    Wt Readings from Last 3 Encounters:  10/19/15 152 lb 4 oz (69.06 kg)  08/15/15 150 lb 6 oz (68.21 kg)  07/21/15 145 lb 12 oz (66.112 kg)   BP Readings from Last 3 Encounters:  10/19/15 156/66  08/15/15 146/56  07/21/15 143/69    Past Medical History  Diagnosis Date  . Chronic kidney disease   . Syncope and collapse June 2012    pre syncopal  . Hypertension    Family History  Problem Relation Age of Onset  . Hypertension Other   . Diabetes Neg Hx    Past Surgical History  Procedure Laterality Date  . Prostate surgery    . Intraocular lens implant, secondary  1990    bilateral implant  . Gallbladder surgery    . Hernia repair    . Kidney stone surgery     Social History   Social History  . Marital Status: Married    Spouse Name: N/A  . Number of Children: N/A  . Years of Education: N/A   Social History Main Topics  . Smoking status: Former Smoker -- 1.00 packs/day for 20 years    Types: Cigarettes    Quit date: 10/01/1949  . Smokeless tobacco: Never Used  . Alcohol Use: No  . Drug Use: No  . Sexual Activity: Not Asked   Other Topics Concern  . None   Social History Narrative    Review of Systems  Constitutional: Positive for fatigue. Negative for fever, chills and activity change.  HENT: Negative for congestion, ear discharge, ear pain, hearing loss, nosebleeds, postnasal drip, rhinorrhea, sinus pressure, sneezing, sore throat, tinnitus, trouble swallowing and voice change.   Eyes: Negative for discharge, redness, itching and visual disturbance.  Respiratory: Positive for cough. Negative for chest tightness, shortness of breath, wheezing and  stridor.   Cardiovascular: Negative for chest pain and leg swelling.  Musculoskeletal: Negative for myalgias, arthralgias, neck pain and neck stiffness.  Skin: Negative for color change and rash.  Neurological: Positive for weakness. Negative for dizziness, facial asymmetry and headaches.  Psychiatric/Behavioral: Negative for sleep disturbance.       Objective:    BP 156/66 mmHg  Pulse 92  Temp(Src) 98.3 F (36.8 C) (Oral)  Ht  (1.6 m)  Wt 152 lb 4 oz (69.06 kg)  BMI 26.98 kg/m2  SpO2 92% Physical Exam  Constitutional: He is oriented to person, place, and time. He appears well-developed and well-nourished. No distress.  HENT:  Head: Normocephalic and atraumatic.  Right Ear: External ear normal.  Left Ear: External ear normal.  Nose: Nose normal.  Mouth/Throat: Oropharynx is clear and moist. No oropharyngeal exudate.  Eyes: Conjunctivae and EOM are normal. Pupils are equal, round, and reactive to light. Right eye exhibits no discharge. Left eye exhibits no discharge. No scleral icterus.  Neck: Normal range of motion. Neck supple. No tracheal deviation present. No thyromegaly present.  Cardiovascular: Normal rate, regular rhythm and normal heart sounds.  Exam reveals no gallop and no friction rub.   No murmur heard. Pulmonary/Chest: Effort normal. No accessory muscle usage. No tachypnea. No respiratory distress. He has  no decreased breath sounds. He has no wheezes. He has rhonchi in the right middle field and the right lower field. He has no rales. He exhibits no tenderness.  Musculoskeletal: Normal range of motion. He exhibits no edema.  Lymphadenopathy:    He has no cervical adenopathy.  Neurological: He is alert and oriented to person, place, and time. No cranial nerve deficit. Coordination normal.  Skin: Skin is warm and dry. No rash noted. He is not diaphoretic. No erythema. No pallor.  Psychiatric: He has a normal mood and affect. His behavior is normal. Judgment and  thought content normal.          Assessment & Plan:   Problem List Items Addressed This Visit      Unprioritized   Cough - Primary    One week of cough with crackles noted on right lower and mid lung exam.  Recent h/o CAP. Will get CBC and CMP today. CXR today. Start Doxycycline. Follow up by email tomorrow and in a visit in 1 week and prn.      Relevant Orders   CBC with Differential/Platelet   Comprehensive metabolic panel   DG Chest 2 View       Return in about 1 week (around 10/26/2015) for Recheck.

## 2015-10-19 NOTE — Assessment & Plan Note (Signed)
One week of cough with crackles noted on right lower and mid lung exam.  Recent h/o CAP. Will get CBC and CMP today. CXR today. Start Doxycycline. Follow up by email tomorrow and in a visit in 1 week and prn.

## 2015-10-19 NOTE — Progress Notes (Signed)
Pre visit review using our clinic review tool, if applicable. No additional management support is needed unless otherwise documented below in the visit note. 

## 2015-10-19 NOTE — Patient Instructions (Signed)
Labs today.  Chest xray today at [facility].  Email with update tomorrow [email or web address]

## 2015-10-20 LAB — CBC WITH DIFFERENTIAL/PLATELET
BASOS ABS: 0 10*3/uL (ref 0.0–0.1)
BASOS PCT: 0.8 % (ref 0.0–3.0)
EOS ABS: 0.1 10*3/uL (ref 0.0–0.7)
Eosinophils Relative: 3.1 % (ref 0.0–5.0)
HEMATOCRIT: 30.8 % — AB (ref 39.0–52.0)
Hemoglobin: 10.1 g/dL — ABNORMAL LOW (ref 13.0–17.0)
LYMPHS ABS: 0.9 10*3/uL (ref 0.7–4.0)
LYMPHS PCT: 20.2 % (ref 12.0–46.0)
MCHC: 32.7 g/dL (ref 30.0–36.0)
MCV: 90.2 fl (ref 78.0–100.0)
Monocytes Absolute: 0.3 10*3/uL (ref 0.1–1.0)
Monocytes Relative: 7.9 % (ref 3.0–12.0)
NEUTROS ABS: 3 10*3/uL (ref 1.4–7.7)
NEUTROS PCT: 68 % (ref 43.0–77.0)
PLATELETS: 174 10*3/uL (ref 150.0–400.0)
RBC: 3.42 Mil/uL — ABNORMAL LOW (ref 4.22–5.81)
RDW: 16.4 % — AB (ref 11.5–15.5)
WBC: 4.4 10*3/uL (ref 4.0–10.5)

## 2015-10-20 LAB — COMPREHENSIVE METABOLIC PANEL
ALBUMIN: 3.9 g/dL (ref 3.5–5.2)
ALK PHOS: 88 U/L (ref 39–117)
ALT: 11 U/L (ref 0–53)
AST: 18 U/L (ref 0–37)
BILIRUBIN TOTAL: 0.3 mg/dL (ref 0.2–1.2)
BUN: 48 mg/dL — ABNORMAL HIGH (ref 6–23)
CALCIUM: 9.1 mg/dL (ref 8.4–10.5)
CO2: 22 mEq/L (ref 19–32)
CREATININE: 2.25 mg/dL — AB (ref 0.40–1.50)
Chloride: 112 mEq/L (ref 96–112)
GFR: 28.5 mL/min — ABNORMAL LOW (ref >60.00)
Glucose, Bld: 94 mg/dL (ref 70–99)
Potassium: 4.4 mEq/L (ref 3.5–5.1)
Sodium: 140 mEq/L (ref 135–145)
Total Protein: 6.1 g/dL (ref 6.0–8.3)

## 2015-10-26 ENCOUNTER — Ambulatory Visit (INDEPENDENT_AMBULATORY_CARE_PROVIDER_SITE_OTHER): Payer: Medicare Other | Admitting: Internal Medicine

## 2015-10-26 ENCOUNTER — Encounter: Payer: Self-pay | Admitting: Internal Medicine

## 2015-10-26 VITALS — BP 175/52 | HR 63 | Temp 97.8°F | Ht 63.0 in | Wt 153.8 lb

## 2015-10-26 DIAGNOSIS — R05 Cough: Secondary | ICD-10-CM | POA: Diagnosis not present

## 2015-10-26 DIAGNOSIS — R059 Cough, unspecified: Secondary | ICD-10-CM

## 2015-10-26 NOTE — Patient Instructions (Signed)
Finish Doxycyline.  Follow up as needed.

## 2015-10-26 NOTE — Progress Notes (Signed)
Pre visit review using our clinic review tool, if applicable. No additional management support is needed unless otherwise documented below in the visit note. 

## 2015-10-26 NOTE — Assessment & Plan Note (Signed)
Symptoms improved after treatment with doxycycline. Exam normal today. Follow up prn and for regular follow up in 3 months.

## 2015-10-26 NOTE — Progress Notes (Signed)
Subjective:    Patient ID: [name], male    DOB: 01/17/1911, 82 y.o.   MRN: [id number]  HPI 82YO male presents for follow up.  Recently treated for bronchitis with doxycycline.  Cough improved. Tolerating Doxycycline well. No fever.  Son reports he has excellent appetite. Went out to breakfast with him this morning. No weakness noted.   Wt Readings from Last 3 Encounters:  10/26/15 153 lb 12 oz (69.741 kg)  10/19/15 152 lb 4 oz (69.06 kg)  08/15/15 150 lb 6 oz (68.21 kg)   BP Readings from Last 3 Encounters:  10/26/15 175/52  10/19/15 156/66  08/15/15 146/56    Past Medical History  Diagnosis Date  . Chronic kidney disease   . Syncope and collapse June 2012    pre syncopal  . Hypertension    Family History  Problem Relation Age of Onset  . Hypertension Other   . Diabetes Neg Hx    Past Surgical History  Procedure Laterality Date  . Prostate surgery    . Intraocular lens implant, secondary  1990    bilateral implant  . Gallbladder surgery    . Hernia repair    . Kidney stone surgery     Social History   Social History  . Marital Status: Married    Spouse Name: N/A  . Number of Children: N/A  . Years of Education: N/A   Social History Main Topics  . Smoking status: Former Smoker -- 1.00 packs/day for 20 years    Types: Cigarettes    Quit date: 10/01/1949  . Smokeless tobacco: Never Used  . Alcohol Use: No  . Drug Use: No  . Sexual Activity: Not Asked   Other Topics Concern  . None   Social History Narrative    Review of Systems  Constitutional: Negative for fever, chills, activity change and fatigue.  HENT: Negative for congestion, ear discharge, ear pain, hearing loss, nosebleeds, postnasal drip, rhinorrhea, sinus pressure, sneezing, sore throat, tinnitus, trouble swallowing and voice change.   Eyes: Negative for discharge, redness, itching and visual disturbance.  Respiratory: Negative for cough, chest tightness, shortness of  breath, wheezing and stridor.   Cardiovascular: Negative for chest pain and leg swelling.  Musculoskeletal: Negative for myalgias, arthralgias, neck pain and neck stiffness.  Skin: Negative for color change and rash.  Neurological: Negative for dizziness, facial asymmetry and headaches.  Psychiatric/Behavioral: Negative for sleep disturbance.       Objective:    BP 175/52 mmHg  Pulse 63  Temp(Src) 97.8 F (36.6 C) (Oral)  Ht  (1.6 m)  Wt 153 lb 12 oz (69.741 kg)  BMI 27.24 kg/m2  SpO2 100% Physical Exam  Constitutional: He is oriented to person, place, and time. He appears well-developed and well-nourished. No distress.  HENT:  Head: Normocephalic and atraumatic.  Right Ear: External ear normal. Decreased hearing is noted.  Left Ear: External ear normal. Decreased hearing is noted.  Nose: Nose normal.  Mouth/Throat: Oropharynx is clear and moist. No oropharyngeal exudate.  Eyes: Conjunctivae and EOM are normal. Pupils are equal, round, and reactive to light. Right eye exhibits no discharge. Left eye exhibits no discharge. No scleral icterus.  Neck: Normal range of motion. Neck supple. No tracheal deviation present. No thyromegaly present.  Cardiovascular: Normal rate, regular rhythm and normal heart sounds.  Exam reveals no gallop and no friction rub.   No murmur heard. Pulmonary/Chest: Effort normal and breath sounds normal. No accessory muscle usage. No  tachypnea. No respiratory distress. He has no decreased breath sounds. He has no wheezes. He has no rhonchi. He has no rales. He exhibits no tenderness.  Musculoskeletal: Normal range of motion. He exhibits no edema.  Lymphadenopathy:    He has no cervical adenopathy.  Neurological: He is alert and oriented to person, place, and time. No cranial nerve deficit. Coordination normal.  Skin: Skin is warm and dry. No rash noted. He is not diaphoretic. No erythema. No pallor.  Psychiatric: He has a normal mood and affect. His  behavior is normal. Judgment and thought content normal.          Assessment & Plan:   Problem List Items Addressed This Visit      Unprioritized   Cough - Primary    Symptoms improved after treatment with doxycycline. Exam normal today. Follow up prn and for regular follow up in 3 months.          Return in about 3 months (around 01/23/2016) for Recheck.

## 2016-01-10 ENCOUNTER — Ambulatory Visit (INDEPENDENT_AMBULATORY_CARE_PROVIDER_SITE_OTHER): Payer: Medicare Other | Admitting: Podiatry

## 2016-01-10 DIAGNOSIS — B351 Tinea unguium: Secondary | ICD-10-CM | POA: Diagnosis not present

## 2016-01-10 DIAGNOSIS — M79676 Pain in unspecified toe(s): Secondary | ICD-10-CM

## 2016-01-10 NOTE — Progress Notes (Signed)
Patient ID: Pedro PlumberJohnie Allen [name], male   DOB: 08/31/1911, [age] y.o.   MRN: [id number]  Subjective: 82 y.o. returns the office today for painful, elongated, thickened toenails which he is unable to do himself. Denies any redness or drainage around the nails. Denies any acute changes since last appointment and no new complaints today. Denies any systemic complaints such as fevers, chills, nausea, vomiting.   Objective: AAO 3, NAD; presents with son DP/PT pulses palpable, CRT less than 3 seconds Nails hypertrophic, dystrophic, elongated, brittle, discolored 10. There is tenderness overlying the nails 1-5 bilaterally. There is no surrounding erythema or drainage along the nail sites. Hyperkeratotic lesion right medial hallux. Upon debridement there is no underlying ulceration, drainage or other signs of infection. No open lesions or otherpre-ulcerative lesions are identified. No other areas of tenderness bilateral lower extremities. No overlying edema, erythema, increased warmth.   No pain with calf compression, swelling, warmth, erythema.  Assessment: Patient presents with symptomatic onychomycosis  Plan: -Treatment options including alternatives, risks, complications were discussed -Nails sharply debrided 10 without complication/bleeding. -Hyperkeratotic lesion debrided without complication/bleeding. -Discussed daily foot inspection. If there are any changes, to call the office immediately.  -Follow-up in 3 months or sooner if any problems are to arise. In the meantime, encouraged to call the office with any questions, concerns, changes symptoms.  Ovid CurdMatthew [name], DPM

## 2016-01-24 ENCOUNTER — Ambulatory Visit (INDEPENDENT_AMBULATORY_CARE_PROVIDER_SITE_OTHER): Payer: Medicare Other | Admitting: Internal Medicine

## 2016-01-24 ENCOUNTER — Encounter: Payer: Self-pay | Admitting: Internal Medicine

## 2016-01-24 VITALS — BP 120/60 | HR 74 | Ht 63.0 in | Wt 158.0 lb

## 2016-01-24 DIAGNOSIS — D649 Anemia, unspecified: Secondary | ICD-10-CM

## 2016-01-24 DIAGNOSIS — N183 Chronic kidney disease, stage 3 unspecified: Secondary | ICD-10-CM

## 2016-01-24 DIAGNOSIS — I159 Secondary hypertension, unspecified: Secondary | ICD-10-CM | POA: Diagnosis not present

## 2016-01-24 LAB — CBC
HCT: 30 % — ABNORMAL LOW (ref 39.0–52.0)
HEMOGLOBIN: 9.8 g/dL — AB (ref 13.0–17.0)
MCHC: 32.7 g/dL (ref 30.0–36.0)
MCV: 90.9 fl (ref 78.0–100.0)
PLATELETS: 153 10*3/uL (ref 150.0–400.0)
RBC: 3.3 Mil/uL — ABNORMAL LOW (ref 4.22–5.81)
RDW: 16.1 % — ABNORMAL HIGH (ref 11.5–15.5)
WBC: 4.4 10*3/uL (ref 4.0–10.5)

## 2016-01-24 LAB — COMPREHENSIVE METABOLIC PANEL
ALT: 14 U/L (ref 0–53)
AST: 20 U/L (ref 0–37)
Albumin: 3.7 g/dL (ref 3.5–5.2)
Alkaline Phosphatase: 65 U/L (ref 39–117)
BILIRUBIN TOTAL: 0.4 mg/dL (ref 0.2–1.2)
BUN: 43 mg/dL — ABNORMAL HIGH (ref 6–23)
CALCIUM: 8.8 mg/dL (ref 8.4–10.5)
CHLORIDE: 114 meq/L — AB (ref 96–112)
CO2: 21 meq/L (ref 19–32)
Creatinine, Ser: 1.99 mg/dL — ABNORMAL HIGH (ref 0.40–1.50)
GFR: 32.82 mL/min — AB (ref >60.00)
Glucose, Bld: 83 mg/dL (ref 70–99)
Potassium: 4.2 mEq/L (ref 3.5–5.1)
Sodium: 141 mEq/L (ref 135–145)
Total Protein: 6 g/dL (ref 6.0–8.3)

## 2016-01-24 LAB — VITAMIN B12: VITAMIN B 12: 389 pg/mL (ref 211–911)

## 2016-01-24 NOTE — Patient Instructions (Signed)
Labs today.   Follow up in 3 months.  

## 2016-01-24 NOTE — Progress Notes (Signed)
Subjective:    Patient ID: Ranae PlumberJohnie Allen Humbarger, male    DOB: 02/02/1911, [age] y.o.   MRN: [id number]  HPI  [age]YO male presents for follow up.  Feels weak on occasion. But generally, feeling well. Uses walker if out and walking for longer period of time. No recent falls. Daughter is with him today and has no concerns. His appetite has been good.  Wt Readings from Last 3 Encounters:  01/24/16 158 lb (71.668 kg)  10/26/15 153 lb 12 oz (69.741 kg)  10/19/15 152 lb 4 oz (69.06 kg)   BP Readings from Last 3 Encounters:  01/24/16 120/60  10/26/15 175/52  10/19/15 156/66    Past Medical History  Diagnosis Date  . Chronic kidney disease   . Syncope and collapse June 2012    pre syncopal  . Hypertension    Family History  Problem Relation Age of Onset  . Hypertension Other   . Diabetes Neg Hx    Past Surgical History  Procedure Laterality Date  . Prostate surgery    . Intraocular lens implant, secondary  1990    bilateral implant  . Gallbladder surgery    . Hernia repair    . Kidney stone surgery     Social History   Social History  . Marital Status: Married    Spouse Name: N/A  . Number of Children: N/A  . Years of Education: N/A   Social History Main Topics  . Smoking status: Former Smoker -- 1.00 packs/day for 20 years    Types: Cigarettes    Quit date: 10/01/1949  . Smokeless tobacco: Never Used  . Alcohol Use: No  . Drug Use: No  . Sexual Activity: Not Asked   Other Topics Concern  . None   Social History Narrative    Review of Systems  Constitutional: Negative for fever, chills, activity change, appetite change, fatigue and unexpected weight change.  Eyes: Negative for visual disturbance.  Respiratory: Negative for cough and shortness of breath.   Cardiovascular: Negative for chest pain, palpitations and leg swelling.  Gastrointestinal: Negative for nausea, vomiting, abdominal pain, diarrhea, constipation and abdominal distention.  Genitourinary:  Negative for dysuria, urgency and difficulty urinating.  Musculoskeletal: Positive for gait problem. Negative for myalgias and arthralgias.  Skin: Negative for color change and rash.  Neurological: Positive for weakness.  Hematological: Negative for adenopathy.  Psychiatric/Behavioral: Negative for sleep disturbance and dysphoric mood. The patient is not nervous/anxious.        Objective:    BP 120/60 mmHg  Pulse 74  Ht 5\' 3"  (1.6 m)  Wt 158 lb (71.668 kg)  BMI 28.00 kg/m2  SpO2 96% Physical Exam  Constitutional: He is oriented to person, place, and time. He appears well-developed and well-nourished. No distress.  HENT:  Head: Normocephalic and atraumatic.  Right Ear: External ear normal. Decreased hearing is noted.  Left Ear: External ear normal. Decreased hearing is noted.  Nose: Nose normal.  Mouth/Throat: Oropharynx is clear and moist. No oropharyngeal exudate.  Eyes: Conjunctivae and EOM are normal. Pupils are equal, round, and reactive to light. Right eye exhibits no discharge. Left eye exhibits no discharge. No scleral icterus.  Neck: Normal range of motion. Neck supple. No tracheal deviation present. No thyromegaly present.  Cardiovascular: Normal rate, regular rhythm and normal heart sounds.  Exam reveals no gallop and no friction rub.   No murmur heard. Pulmonary/Chest: Effort normal and breath sounds normal. No accessory muscle usage. No tachypnea. No respiratory distress.  He has no decreased breath sounds. He has no wheezes. He has no rhonchi. He has no rales. He exhibits no tenderness.  Musculoskeletal: Normal range of motion. He exhibits no edema.  Lymphadenopathy:    He has no cervical adenopathy.  Neurological: He is alert and oriented to person, place, and time. No cranial nerve deficit. Coordination normal.  Skin: Skin is warm and dry. No rash noted. He is not diaphoretic. No erythema. No pallor.  Psychiatric: He has a normal mood and affect. His behavior is  normal. Judgment and thought content normal.          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 Will check CBC and B12 with labs today.      Relevant Orders   B12   CBC   CRI (chronic renal insufficiency)    Repeat renal function with labs today.      HTN (hypertension) - Primary    BP Readings from Last 3 Encounters:  01/24/16 120/60  10/26/15 175/52  10/19/15 156/66   BP well controlled. Renal function with labs.      Relevant Orders   Comprehensive metabolic panel       Return in about 3 months (around 04/25/2016) for Recheck.  Ronna Polio, MD Internal Medicine [facility]

## 2016-01-24 NOTE — Assessment & Plan Note (Signed)
BP Readings from Last 3 Encounters:  01/24/16 120/60  10/26/15 175/52  10/19/15 156/66   BP well controlled. Renal function with labs.

## 2016-01-24 NOTE — Assessment & Plan Note (Signed)
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 Will check CBC and B12 with labs today.

## 2016-01-24 NOTE — Assessment & Plan Note (Signed)
Repeat renal function with labs today. 

## 2016-01-24 NOTE — Progress Notes (Signed)
Pre visit review using our clinic review tool, if applicable. No additional management support is needed unless otherwise documented below in the visit note. 

## 2016-01-25 ENCOUNTER — Telehealth: Payer: Self-pay

## 2016-01-25 ENCOUNTER — Ambulatory Visit (INDEPENDENT_AMBULATORY_CARE_PROVIDER_SITE_OTHER): Payer: Medicare Other | Admitting: Internal Medicine

## 2016-01-25 DIAGNOSIS — E538 Deficiency of other specified B group vitamins: Secondary | ICD-10-CM | POA: Diagnosis not present

## 2016-01-25 MED ORDER — CYANOCOBALAMIN 1000 MCG/ML IJ SOLN
1000.0000 ug | Freq: Once | INTRAMUSCULAR | Status: AC
  Administered 2016-01-25: 1000 ug via INTRAMUSCULAR

## 2016-01-25 NOTE — Telephone Encounter (Signed)
-----   Message from Shelia MediaJennifer A Walker, MD sent at 01/24/2016  6:10 PM EDT ----- Labs show slight improvement in kidney function and relatively stable anemia. B12 is slightly low. It might be helpful to start monthly B12 injections, as this may help with symptoms of weakness.

## 2016-01-25 NOTE — Telephone Encounter (Signed)
Patient daughter aware of lab results.  She would like to go ahead and set up B12 injections.  Please place orders.

## 2016-01-25 NOTE — Telephone Encounter (Signed)
I think we just have to get him on the nurse schedule for monthly B12 injections.

## 2016-01-25 NOTE — Telephone Encounter (Signed)
Ok, scheduled.

## 2016-02-09 ENCOUNTER — Emergency Department: Payer: Medicare Other

## 2016-02-09 ENCOUNTER — Telehealth: Payer: Self-pay | Admitting: Internal Medicine

## 2016-02-09 ENCOUNTER — Encounter: Payer: Self-pay | Admitting: Emergency Medicine

## 2016-02-09 ENCOUNTER — Inpatient Hospital Stay
Admission: EM | Admit: 2016-02-09 | Discharge: 2016-02-12 | DRG: 194 | Disposition: A | Discharge status: Hospice | Payer: Medicare Other | Attending: Internal Medicine | Admitting: Internal Medicine

## 2016-02-09 DIAGNOSIS — Z66 Do not resuscitate: Secondary | ICD-10-CM | POA: Diagnosis present

## 2016-02-09 DIAGNOSIS — W19XXXA Unspecified fall, initial encounter: Secondary | ICD-10-CM | POA: Diagnosis present

## 2016-02-09 DIAGNOSIS — Z79899 Other long term (current) drug therapy: Secondary | ICD-10-CM | POA: Diagnosis not present

## 2016-02-09 DIAGNOSIS — R7989 Other specified abnormal findings of blood chemistry: Secondary | ICD-10-CM

## 2016-02-09 DIAGNOSIS — G3184 Mild cognitive impairment, so stated: Secondary | ICD-10-CM | POA: Diagnosis present

## 2016-02-09 DIAGNOSIS — D638 Anemia in other chronic diseases classified elsewhere: Secondary | ICD-10-CM | POA: Diagnosis present

## 2016-02-09 DIAGNOSIS — R778 Other specified abnormalities of plasma proteins: Secondary | ICD-10-CM

## 2016-02-09 DIAGNOSIS — R001 Bradycardia, unspecified: Secondary | ICD-10-CM | POA: Diagnosis present

## 2016-02-09 DIAGNOSIS — I129 Hypertensive chronic kidney disease with stage 1 through stage 4 chronic kidney disease, or unspecified chronic kidney disease: Secondary | ICD-10-CM | POA: Diagnosis present

## 2016-02-09 DIAGNOSIS — Z87891 Personal history of nicotine dependence: Secondary | ICD-10-CM | POA: Diagnosis not present

## 2016-02-09 DIAGNOSIS — Z880 Allergy status to penicillin: Secondary | ICD-10-CM

## 2016-02-09 DIAGNOSIS — Z8249 Family history of ischemic heart disease and other diseases of the circulatory system: Secondary | ICD-10-CM | POA: Diagnosis not present

## 2016-02-09 DIAGNOSIS — Z882 Allergy status to sulfonamides status: Secondary | ICD-10-CM

## 2016-02-09 DIAGNOSIS — N184 Chronic kidney disease, stage 4 (severe): Secondary | ICD-10-CM | POA: Diagnosis present

## 2016-02-09 DIAGNOSIS — J189 Pneumonia, unspecified organism: Secondary | ICD-10-CM | POA: Diagnosis present

## 2016-02-09 DIAGNOSIS — Z887 Allergy status to serum and vaccine status: Secondary | ICD-10-CM | POA: Diagnosis not present

## 2016-02-09 DIAGNOSIS — Z881 Allergy status to other antibiotic agents status: Secondary | ICD-10-CM | POA: Diagnosis not present

## 2016-02-09 DIAGNOSIS — Z888 Allergy status to other drugs, medicaments and biological substances status: Secondary | ICD-10-CM

## 2016-02-09 DIAGNOSIS — R0602 Shortness of breath: Secondary | ICD-10-CM

## 2016-02-09 LAB — COMPREHENSIVE METABOLIC PANEL
ALT: 16 U/L — AB (ref 17–63)
AST: 27 U/L (ref 15–41)
Albumin: 3.4 g/dL — ABNORMAL LOW (ref 3.5–5.0)
Alkaline Phosphatase: 58 U/L (ref 38–126)
Anion gap: 6 (ref 5–15)
BILIRUBIN TOTAL: 0.7 mg/dL (ref 0.3–1.2)
BUN: 40 mg/dL — AB (ref 6–20)
CALCIUM: 8.4 mg/dL — AB (ref 8.9–10.3)
CO2: 20 mmol/L — ABNORMAL LOW (ref 22–32)
CREATININE: 2.27 mg/dL — AB (ref 0.61–1.24)
Chloride: 114 mmol/L — ABNORMAL HIGH (ref 101–111)
GFR calc Af Amer: 25 mL/min — ABNORMAL LOW (ref >60)
GFR, EST NON AFRICAN AMERICAN: 22 mL/min — AB (ref >60)
Glucose, Bld: 115 mg/dL — ABNORMAL HIGH (ref 65–99)
Potassium: 4.3 mmol/L (ref 3.5–5.1)
Sodium: 140 mmol/L (ref 135–145)
TOTAL PROTEIN: 5.9 g/dL — AB (ref 6.5–8.1)

## 2016-02-09 LAB — CBC
HEMATOCRIT: 28.6 % — AB (ref 40.0–52.0)
Hemoglobin: 9.3 g/dL — ABNORMAL LOW (ref 13.0–18.0)
MCH: 29.6 pg (ref 26.0–34.0)
MCHC: 32.6 g/dL (ref 32.0–36.0)
MCV: 90.7 fL (ref 80.0–100.0)
Platelets: 155 10*3/uL (ref 150–440)
RBC: 3.16 MIL/uL — ABNORMAL LOW (ref 4.40–5.90)
RDW: 15.8 % — AB (ref 11.5–14.5)
WBC: 11.2 10*3/uL — AB (ref 3.8–10.6)

## 2016-02-09 LAB — URINALYSIS COMPLETE WITH MICROSCOPIC (ARMC ONLY)
BILIRUBIN URINE: NEGATIVE
Bacteria, UA: NONE SEEN
Glucose, UA: NEGATIVE mg/dL
Hgb urine dipstick: NEGATIVE
KETONES UR: NEGATIVE mg/dL
Leukocytes, UA: NEGATIVE
Nitrite: NEGATIVE
Protein, ur: NEGATIVE mg/dL
SQUAMOUS EPITHELIAL / LPF: NONE SEEN
Specific Gravity, Urine: 1.011 (ref 1.005–1.030)
pH: 5 (ref 5.0–8.0)

## 2016-02-09 LAB — TROPONIN I
TROPONIN I: 0.11 ng/mL — AB (ref <0.031)
Troponin I: 0.11 ng/mL — ABNORMAL HIGH (ref <0.031)

## 2016-02-09 MED ORDER — GLUCOSAMINE-CHONDROITIN 500-400 MG PO TABS: 1.0000 | ORAL_TABLET | Freq: Three times a day (TID) | ORAL | Status: DC

## 2016-02-09 MED ORDER — NYSTATIN-TRIAMCINOLONE 100000-0.1 UNIT/GM-% EX OINT
1.0000 "application " | TOPICAL_OINTMENT | Freq: Two times a day (BID) | CUTANEOUS | Status: DC
  Administered 2016-02-10 – 2016-02-12 (×4): 1 via TOPICAL
  Filled 2016-02-09 (×2): qty 15

## 2016-02-09 MED ORDER — DOXYCYCLINE HYCLATE 100 MG PO TABS
100.0000 mg | ORAL_TABLET | Freq: Two times a day (BID) | ORAL | Status: DC
  Administered 2016-02-10 – 2016-02-12 (×5): 100 mg via ORAL
  Filled 2016-02-09 (×5): qty 1

## 2016-02-09 MED ORDER — PANTOPRAZOLE SODIUM 40 MG PO TBEC
40.0000 mg | DELAYED_RELEASE_TABLET | Freq: Every day | ORAL | Status: DC
  Administered 2016-02-10 – 2016-02-12 (×3): 40 mg via ORAL
  Filled 2016-02-09 (×3): qty 1

## 2016-02-09 MED ORDER — HEPARIN SODIUM (PORCINE) 5000 UNIT/ML IJ SOLN
5000.0000 [IU] | Freq: Three times a day (TID) | INTRAMUSCULAR | Status: DC
  Administered 2016-02-09 – 2016-02-11 (×6): 5000 [IU] via SUBCUTANEOUS
  Filled 2016-02-09 (×6): qty 1

## 2016-02-09 MED ORDER — DEXTROSE 5 % IV SOLN
1.0000 g | Freq: Once | INTRAVENOUS | Status: AC
  Administered 2016-02-09: 1 g via INTRAVENOUS
  Filled 2016-02-09: qty 10

## 2016-02-09 MED ORDER — DOXYCYCLINE HYCLATE 100 MG PO TABS
100.0000 mg | ORAL_TABLET | Freq: Once | ORAL | Status: AC
  Administered 2016-02-09: 100 mg via ORAL
  Filled 2016-02-09: qty 1

## 2016-02-09 MED ORDER — HYDRALAZINE HCL 25 MG PO TABS
25.0000 mg | ORAL_TABLET | Freq: Four times a day (QID) | ORAL | Status: DC
  Administered 2016-02-09 – 2016-02-12 (×10): 25 mg via ORAL
  Filled 2016-02-09 (×10): qty 1

## 2016-02-09 MED ORDER — OCUVITE-LUTEIN PO CAPS
1.0000 | ORAL_CAPSULE | Freq: Every day | ORAL | Status: DC
  Administered 2016-02-10 – 2016-02-12 (×3): 1 via ORAL
  Filled 2016-02-09 (×3): qty 1

## 2016-02-09 MED ORDER — CYANOCOBALAMIN 1000 MCG/ML IJ SOLN
1000.0000 ug | INTRAMUSCULAR | Status: DC
  Filled 2016-02-09: qty 1

## 2016-02-09 MED ORDER — DEXTROSE 5 % IV SOLN
1.0000 g | INTRAVENOUS | Status: DC
  Administered 2016-02-10 – 2016-02-11 (×2): 1 g via INTRAVENOUS
  Filled 2016-02-09 (×3): qty 10

## 2016-02-09 MED ORDER — DOCUSATE SODIUM 100 MG PO CAPS
100.0000 mg | ORAL_CAPSULE | Freq: Two times a day (BID) | ORAL | Status: DC
  Administered 2016-02-09 – 2016-02-12 (×5): 100 mg via ORAL
  Filled 2016-02-09 (×5): qty 1

## 2016-02-09 MED ORDER — FERROUS SULFATE 325 (65 FE) MG PO TABS
325.0000 mg | ORAL_TABLET | Freq: Every day | ORAL | Status: DC
Start: 2016-02-09 — End: 2016-02-12
  Administered 2016-02-10 – 2016-02-12 (×3): 325 mg via ORAL
  Filled 2016-02-09 (×3): qty 1

## 2016-02-09 NOTE — Telephone Encounter (Signed)
[facility] - [place] TELEPHONE ADVICE RECORD [facility] Patient Name: [name] DOB: 03/17/1911 Initial Comment Caller states, father fell this morning, due to weakness in knees. He has a slight fever and some productive cough. Nurse Assessment Nurse: Debera Latalston, RN, Tinnie GensJeffrey Date/Time Lamount Cohen(Eastern Time): 02/09/2016 11:32:46 AM Confirm and document reason for call. If symptomatic, describe symptoms. You must click the next button to save text entered. ---Caller states, she hurt her ankle last Thursday, while jogging. It got better in about 3 days, then after jogging again, it seems to be swollen and has shooting pain when she walks. Fell on Saturday and had to get stitches in his arm. Has the patient traveled out of the country within the last 30 days? ---No Does the patient have any new or worsening symptoms? ---Yes Will a triage be completed? ---Yes Related visit to physician within the last 2 weeks? ---No Does the PT have any chronic conditions? (i.e. diabetes, asthma, etc.) ---Yes List chronic conditions. ---HTN Is this a behavioral health or substance abuse call? ---No Guidelines Guideline Title Affirmed Question Affirmed Notes Weakness (Generalized) and Fatigue Patient sounds very sick or weak to the triager Cough - Acute Productive Coughing up rusty-colored (reddish-brown) sputum Final Disposition User Go to ED Now (or PCP triage) Debera Latalston, RN, Tinnie GensJeffrey Comments Caller was [facility]. Referrals GO TO FACILITY OTHER - SPECIFY Disagree/Comply: Comply

## 2016-02-09 NOTE — ED Notes (Signed)
Family at bedside, assisting patient to eat.

## 2016-02-09 NOTE — Telephone Encounter (Signed)
Patient is at the ED, thanks  I will follow up with [facility] regarding the second patient in the note.

## 2016-02-09 NOTE — ED Notes (Signed)
Patient lives at home and is cared for by his children.  EMS report that this morning patient fell and EMS was called for lifting assistance.  EMS later called for concerns that patient was weak.  Daughter stated that she was unable to transfer patient to chair with two assist, which is a normal assist.  Patient recently had stiches placed to right elbow, secondary to fall, and daughter concerned that area may have infection.

## 2016-02-09 NOTE — ED Notes (Addendum)
Pt states that he is dry and no pericare needed.

## 2016-02-09 NOTE — Progress Notes (Signed)
PHARMACIST - PHYSICIAN ORDER COMMUNICATION  CONCERNING: P&T Medication Policy on Herbal Medications  DESCRIPTION:  This patient's order for:  Glucosamine-Chondroitin  has been noted.  This product(s) is classified as an "herbal" or natural product. Due to a lack of definitive safety studies or FDA approval, nonstandard manufacturing practices, plus the potential risk of unknown drug-drug interactions while on inpatient medications, the Pharmacy and Therapeutics Committee does not permit the use of "herbal" or natural products of this type within [place].   ACTION TAKEN: The pharmacy department is unable to verify this order at this time and your patient has been informed of this safety policy. Please reevaluate patient's clinical condition at discharge and address if the herbal or natural product(s) should be resumed at that time.   

## 2016-02-09 NOTE — Progress Notes (Signed)
Patient arrived to 2A Room 243. Patient denies pain and all questions answered. Patient oriented to unit and Fall Safety Plan signed by patient's daughters. Skin assessment completed with Lexi RN; laceration to right elbow with stitches noted and bruises scattered throughout. A&Ox4, VSS, and NSR on verified tele box #40-10. Nursing staff will continue to monitor. Lamonte RicherKara A Eldredge Veldhuizen, RN

## 2016-02-09 NOTE — ED Notes (Signed)
Assisted patient to urinate for sample, unable to do so at this time. Pt to CT,.

## 2016-02-09 NOTE — ED Notes (Signed)
Report given to UkraineKara, Charity fundraiserN on floor. Pt taken to floor via stretcher by Rosey Batheresa, RN. Pt stable prior to transport.

## 2016-02-09 NOTE — ED Notes (Signed)
MD notified that pt cannot provide urine sample. PT sat up on side of bed X 2 and attempted. Family at bedside state that pt needs to drink water to given sample. Request reviewed with MD and pt given water.

## 2016-02-09 NOTE — ED Notes (Signed)
Pt back from CT

## 2016-02-09 NOTE — ED Provider Notes (Signed)
[facility] Emergency Department Provider Note  ____________________________________________    I have reviewed the triage vital signs and the nursing notes.   HISTORY  Chief Complaint Weakness    HPI [name] is a 82 y.o. male who was sent to the emergency department because of weakness. Per EMS they were called out this morning for an assist picking the patient up and again called later because both daughters were unable to lift the patient. Reportedly yesterday he was getting around with a walker very well but today things have changed significant only. Patient denies pain. He denies chest pain. No shortness of breath. He does have a productive cough. Daughters report this is very abnormal for him     Past Medical History  Diagnosis Date  . Chronic kidney disease   . Syncope and collapse June 2012    pre syncopal  . Hypertension     Patient Active Problem List   Diagnosis Date Noted  . Stage I pressure ulcer of sacral region 09/08/2015  . Penile lesion 04/01/2015  . Weakness 10/22/2014  . Heart murmur 06/21/2014  . Pulmonary hypertension (HCC) 06/21/2014  . Anemia 07/30/2013  . Skin lesion of face 07/30/2013  . Gait instability 06/25/2013  . Medicare annual wellness visit, subsequent 01/30/2013  . Carotid stenosis 12/18/2012  . Right carotid bruit 06/27/2012  . Penile swelling 05/28/2012  . Edema 03/12/2011  . HTN (hypertension) 03/12/2011  . CRI (chronic renal insufficiency) 03/12/2011    Past Surgical History  Procedure Laterality Date  . Prostate surgery    . Intraocular lens implant, secondary  1990    bilateral implant  . Gallbladder surgery    . Hernia repair    . Kidney stone surgery      Current Outpatient Rx  Name  Route  Sig  Dispense  Refill  . acidophilus (RISAQUAD) CAPS capsule   Oral   Take 1 capsule by mouth daily.         . ferrous sulfate 325 (65 FE) MG EC tablet   Oral   Take 325 mg by mouth  daily.         Marland Kitchen glucosamine-chondroitin 500-400 MG tablet   Oral   Take 1 tablet by mouth 3 (three) times daily.         . hydrALAZINE (APRESOLINE) 25 MG tablet      TAKE 1 TABLET FOUR TIMES A DAY   360 tablet   1   . multivitamin-lutein (OCUVITE-LUTEIN) CAPS capsule   Oral   Take 1 capsule by mouth daily.         Marland Kitchen nystatin-triamcinolone ointment (MYCOLOG)   Topical   Apply 1 application topically 2 (two) times daily as needed (for bed sores/ulcers).         Marland Kitchen omeprazole (PRILOSEC) 20 MG capsule   Oral   Take 20 mg by mouth daily.           Allergies Amoxicillin; Clarithromycin; Influenza vaccines; Levofloxacin; Macrodantin; Metronidazole; Micardis; Nabumetone; Neosporin; Penicillins; Sulfa drugs cross reactors; and Zithromax  Family History  Problem Relation Age of Onset  . Hypertension Other   . Diabetes Neg Hx     Social History Social History  Substance Use Topics  . Smoking status: Former Smoker -- 1.00 packs/day for 20 years    Types: Cigarettes    Quit date: 10/01/1949  . Smokeless tobacco: Never Used  . Alcohol Use: No    Review of Systems  Constitutional: Denies fever  Eyes: Negative for redness ENT: Negative for sore throat Cardiovascular: Negative for chest pain Respiratory: Negative for shortness of breath. Positive cough Gastrointestinal: Negative for abdominal pain Genitourinary: Negative for dysuria. Musculoskeletal: Negative for back pain. Skin: Negative for rash. Neurological: Negative for focal weakness, positive diffuse weakness Psychiatric: no anxiety    ____________________________________________   PHYSICAL EXAM:  VITAL SIGNS: ED Triage Vitals  Enc Vitals Group     BP 02/09/16 1249 137/51 mmHg     Pulse Rate 02/09/16 1249 83     Resp 02/09/16 1249 16     Temp 02/09/16 1249 98.1 F (36.7 C)     Temp Source 02/09/16 1249 Oral     SpO2 02/09/16 1249 95 %     Weight 02/09/16 1249 160 lb (72.576 kg)     Height  02/09/16 1249 5\' 8"  (1.727 m)     Head Cir --      Peak Flow --      Pain Score 02/09/16 1250 0     Pain Loc --      Pain Edu? --      Excl. in GC? --      Constitutional: Alert and oriented. Well appearing and in no distress. Hard of hearing Eyes: Conjunctivae are normal. No erythema or injection ENT   Head: Normocephalic and atraumatic.   Mouth/Throat: Mucous membranes are moist. Cardiovascular: Normal rate, regular rhythm Normal and symmetric distal pulses are present in the upper extremities.  Respiratory: Normal respiratory effort without tachypnea nor retractions. Breath sounds are clear and equal bilaterally.  Gastrointestinal: Soft and non-tender in all quadrants. No distention. There is no CVA tenderness. Genitourinary: deferred Musculoskeletal: Nontender with normal range of motion in all extremities. No lower extremity tenderness nor edema. Neurologic:  Normal speech and language. No gross focal neurologic deficits are appreciated. Skin:  Skin is warm, dry and intact. No rash noted. Psychiatric: Mood and affect are normal. Patient exhibits appropriate insight and judgment.  ____________________________________________    LABS (pertinent positives/negatives)  Labs Reviewed  CBC - Abnormal; Notable for the following:    WBC 11.2 (*)    RBC 3.16 (*)    Hemoglobin 9.3 (*)    HCT 28.6 (*)    RDW 15.8 (*)    All other components within normal limits  COMPREHENSIVE METABOLIC PANEL  TROPONIN I  URINALYSIS COMPLETEWITH MICROSCOPIC ([facility] ONLY)    ____________________________________________   EKG  ED ECG REPORT I, Jene EveryKINNER, Arlis Yale, the attending physician, personally viewed and interpreted this ECG.  Date: 02/09/2016 EKG Time: 12:47 PM Rate: 84 Rhythm: normal sinus rhythm QRS Axis: normal Intervals: normal ST/T Wave abnormalities: normal Conduction Disturbances: none Narrative Interpretation:  unremarkable   ____________________________________________    RADIOLOGY  CT head unremarkable, chest x-ray unremarkable  ____________________________________________   PROCEDURES  Procedure(s) performed: none  Critical Care performed: none  ____________________________________________   INITIAL IMPRESSION / ASSESSMENT AND PLAN / ED COURSE  Pertinent labs & imaging results that were available during my care of the patient were reviewed by me and considered in my medical decision making (see chart for details).  Patient presents with diffuse weakness. Daughters report this is unusual for him. They report a productive cough which is new. He does have a mild elevation in his white blood cell count. Although his chest x-ray is unremarkable the patient is tachypneic so I will send for CT without contrast to evaluate for possible pneumonia. The patient also has an elevated troponin, I suspect this may be chronic  for him.  The patient is unable to ambulate without significant assistance and will require admission  ____________________________________________   FINAL CLINICAL IMPRESSION(S) / ED DIAGNOSES  Generalized weakness Pneumonia       Jene Every, MD 02/09/16 1521

## 2016-02-09 NOTE — ED Notes (Signed)
Pt family inquiring about patient taking daily hydralazine, concerned with BP "going up". MD has been informed and no further orders received at this time. Family updated. Pt under impression that pt is to stay overnight. No admission orders at this time.

## 2016-02-09 NOTE — Telephone Encounter (Signed)
Agree. [name] needs to go to ER for evaluation. The note by [facility] includes information on another patient. ?jogger who fell. We need to make sure this patient was also cared for.

## 2016-02-09 NOTE — H&P (Signed)
[facility] [place] [facility]   PATIENT NAME: [name]    MR#:  [id number]  DATE OF BIRTH:  [birth year]  DATE OF ADMISSION:  02/09/2016  PRIMARY CARE PHYSICIAN: Wynona Dove, MD   REQUESTING/REFERRING PHYSICIAN: Sharma Covert  CHIEF COMPLAINT:   Chief Complaint  Patient presents with  . Weakness    HISTORY OF PRESENT ILLNESS: [name]  is a 82 y.o. male with a known history of Chronic kidney disease, syncopal episode, hypertension, pneumonia in the past, lives at home and his 4 daughters take turns to take care of him at home. Until last night he was at his baseline.  He has some hearing deficit so all this history obtained from 2 of his daughters were present in the room in the emergency room.   Today morning when he woke up he was not very steady on his legs and daughters had to hold him and pushed him with his walker to go from one room to the other.   He could not stand up steadily and was falling down and daughters had to keep him holding for that, concerned with this that thought he is running some infection and they brought him to the emergency room.  In ER he was noted to have pneumonia and started on antibiotic and given his admission to hospitalist team.  7 days ago patient again had a fall when he injured his right elbow and he has some stitches over there taken in ER.   PAST MEDICAL HISTORY:   Past Medical History  Diagnosis Date  . Chronic kidney disease   . Syncope and collapse June 2012    pre syncopal  . Hypertension     PAST SURGICAL HISTORY: Past Surgical History  Procedure Laterality Date  . Prostate surgery    . Intraocular lens implant, secondary  1990    bilateral implant  . Gallbladder surgery    . Hernia repair    . Kidney stone surgery      SOCIAL HISTORY:  Social History  Substance Use Topics  . Smoking status: Former Smoker -- 1.00 packs/day for 20 years    Types: Cigarettes    Quit date: 10/01/1949  .  Smokeless tobacco: Never Used  . Alcohol Use: No    FAMILY HISTORY:  Family History  Problem Relation Age of Onset  . Hypertension Other   . Diabetes Neg Hx     DRUG ALLERGIES:  Allergies  Allergen Reactions  . Amoxicillin Other (See Comments)    Reaction:  Unknown   . Clarithromycin Other (See Comments)    Reaction:  Unknown   . Influenza Vaccines Other (See Comments)    Reaction:  Unknown   . Levofloxacin Other (See Comments)    Reaction:  Unknown   . Macrodantin Other (See Comments)    Reaction:  Unknown   . Metronidazole Other (See Comments)    Reaction:  Unknown   . Micardis [Telmisartan] Other (See Comments)    Reaction:  Unknown   . Nabumetone Other (See Comments)    Reaction:  Unknown   . Neosporin [Neomycin-Polymyxin-Gramicidin] Other (See Comments)    Reaction:  Unknown   . Penicillins Other (See Comments)    Reaction:  Unknown   . Sulfa Drugs Cross Reactors Other (See Comments)    Reaction:  Unknown   . Zithromax [Azithromycin] Diarrhea and Other (See Comments)    Reaction:  Dizziness and headaches    REVIEW OF SYSTEMS:   CONSTITUTIONAL:  No fever,positive fatigue or weakness.  EYES: No blurred or double vision.  EARS, NOSE, AND THROAT: No tinnitus or ear pain.  RESPIRATORY: No cough, shortness of breath, wheezing or hemoptysis.  CARDIOVASCULAR: No chest pain, orthopnea, edema.  GASTROINTESTINAL: No nausea, vomiting, diarrhea or abdominal pain.  GENITOURINARY: No dysuria, hematuria.  ENDOCRINE: No polyuria, nocturia,  HEMATOLOGY: No anemia, easy bruising or bleeding SKIN: No rash or lesion. MUSCULOSKELETAL: No joint pain or arthritis.   NEUROLOGIC: No tingling, numbness, weakness.  PSYCHIATRY: No anxiety or depression.   MEDICATIONS AT HOME:  Prior to Admission medications   Medication Sig Start Date End Date Taking? Authorizing Provider  acidophilus (RISAQUAD) CAPS capsule Take 1 capsule by mouth daily.   Yes Historical Provider, MD   cyanocobalamin (,VITAMIN B-12,) 1000 MCG/ML injection Inject 1,000 mcg into the muscle every 30 (thirty) days.   Yes Historical Provider, MD  ferrous sulfate 325 (65 FE) MG EC tablet Take 325 mg by mouth daily.   Yes Historical Provider, MD  glucosamine-chondroitin 500-400 MG tablet Take 1 tablet by mouth 3 (three) times daily.   Yes Historical Provider, MD  hydrALAZINE (APRESOLINE) 25 MG tablet TAKE 1 TABLET FOUR TIMES A DAY 08/26/15  Yes Shelia MediaJennifer A Walker, MD  multivitamin-lutein [facility](OCUVITE-LUTEIN) CAPS capsule Take 1 capsule by mouth daily.   Yes Historical Provider, MD  nystatin-triamcinolone ointment (MYCOLOG) Apply 1 application topically 2 (two) times daily as needed (for bed sores/ulcers).   Yes Historical Provider, MD  omeprazole (PRILOSEC) 20 MG capsule Take 20 mg by mouth daily.   Yes Historical Provider, MD  potassium gluconate 595 (99 K) MG TABS tablet Take 595 mg by mouth.   Yes Historical Provider, MD      PHYSICAL EXAMINATION:   VITAL SIGNS: Blood pressure 140/57, pulse 68, temperature 98.1 F (36.7 C), temperature source Oral, resp. rate 21, height 5\' 8"  (1.727 m), weight 72.576 kg (160 lb), SpO2 94 %.  GENERAL:  [age] y.o.-year-old patient lying in the bed with no acute distress.  EYES: Pupils equal, round, reactive to light and accommodation. No scleral icterus. Extraocular muscles intact.  HEENT: Head atraumatic, normocephalic. Oropharynx and nasopharynx clear.  NECK:  Supple, no jugular venous distention. No thyroid enlargement, no tenderness.  LUNGS: Normal breath sounds bilaterally, no wheezing, some crepitation. No use of accessory muscles of respiration.  CARDIOVASCULAR: S1, S2 normal. No murmurs, rubs, or gallops.  ABDOMEN: Soft, nontender, nondistended. Bowel sounds present. No organomegaly or mass.  EXTREMITIES: No pedal edema, cyanosis, or clubbing. Has laceration and stitches on his right elbow.  NEUROLOGIC: Cranial nerves II through XII are intact. Muscle strength  3-4/5 in all extremities. Sensation intact. Gait not checked.  PSYCHIATRIC: The patient is alert and oriented x 3.  SKIN: No obvious rash, lesion, or ulcer.   LABORATORY PANEL:   CBC  Recent Labs Lab 02/09/16 1246  WBC 11.2*  HGB 9.3*  HCT 28.6*  PLT 155  MCV 90.7  MCH 29.6  MCHC 32.6  RDW 15.8*   ------------------------------------------------------------------------------------------------------------------  Chemistries   Recent Labs Lab 02/09/16 1246  NA 140  K 4.3  CL 114*  CO2 20*  GLUCOSE 115*  BUN 40*  CREATININE 2.27*  CALCIUM 8.4*  AST 27  ALT 16*  ALKPHOS 58  BILITOT 0.7   ------------------------------------------------------------------------------------------------------------------ estimated creatinine clearance is 15.9 mL/min (by C-G formula based on Cr of 2.27). ------------------------------------------------------------------------------------------------------------------ No results for input(s): TSH, T4TOTAL, T3FREE, THYROIDAB in the last 72 hours.  Invalid input(s): FREET3   Coagulation  profile No results for input(s): INR, PROTIME in the last 168 hours. ------------------------------------------------------------------------------------------------------------------- No results for input(s): DDIMER in the last 72 hours. -------------------------------------------------------------------------------------------------------------------  Cardiac Enzymes  Recent Labs Lab 02/09/16 1246  TROPONINI 0.11*   ------------------------------------------------------------------------------------------------------------------ Invalid input(s): POCBNP  ---------------------------------------------------------------------------------------------------------------  Urinalysis    Component Value Date/Time   COLORURINE YELLOW* 06/30/2015 0445   COLORURINE Yellow 02/08/2012 1338   APPEARANCEUR CLEAR* 06/30/2015 0445   APPEARANCEUR Hazy  02/08/2012 1338   LABSPEC 1.015 06/30/2015 0445   LABSPEC 1.017 02/08/2012 1338   PHURINE 5.0 06/30/2015 0445   PHURINE 5.0 02/08/2012 1338   GLUCOSEU NEGATIVE 06/30/2015 0445   GLUCOSEU Negative 02/08/2012 1338   HGBUR NEGATIVE 06/30/2015 0445   HGBUR Negative 02/08/2012 1338   BILIRUBINUR NEGATIVE 06/30/2015 0445   BILIRUBINUR neg 03/03/2015 1459   BILIRUBINUR Negative 02/08/2012 1338   KETONESUR NEGATIVE 06/30/2015 0445   KETONESUR Negative 02/08/2012 1338   PROTEINUR NEGATIVE 06/30/2015 0445   PROTEINUR neg 03/03/2015 1459   PROTEINUR 30 mg/dL 40/98/1191 4782   UROBILINOGEN 0.2 03/03/2015 1459   NITRITE NEGATIVE 06/30/2015 0445   NITRITE neg 03/03/2015 1459   NITRITE Negative 02/08/2012 1338   LEUKOCYTESUR NEGATIVE 06/30/2015 0445   LEUKOCYTESUR Negative 02/08/2012 1338     RADIOLOGY: Ct Head Wo Contrast  02/09/2016  CLINICAL DATA:  Lethargy following fall EXAM: CT HEAD WITHOUT CONTRAST TECHNIQUE: Contiguous axial images were obtained from the base of the skull through the vertex without intravenous contrast. COMPARISON:  March 07, 2011 FINDINGS: There is moderate diffuse atrophy. There is no intracranial mass, hemorrhage, extra-axial fluid collection, or midline shift. There is patchy small vessel disease in the centra semiovale bilaterally. There is a small lacunar infarct in the anterior left lentiform nucleus, stable. There is small vessel disease in the right external capsule. There is a prior infarct in the upper left cerebellum. No acute appearing infarct is identified. The bony calvarium appears intact. Visualized mastoid air cells are clear. Orbits appear symmetric bilaterally. There is mucosal thickening in the visualized left maxillary antrum. There is also mucosal thickening in a left-sided ethmoid air cell. There is calcification in the cavernous carotid artery regions bilaterally. IMPRESSION: Atrophy with small vessel disease and prior small infarcts as noted above. No  acute infarct evident. No hemorrhage or mass effect. No extra-axial fluid collection or midline shift. Areas of paranasal sinus disease. Electronically Signed   By: Bretta Bang III M.D.   On: 02/09/2016 13:44   Ct Chest Wo Contrast  02/09/2016  CLINICAL DATA:  Weakness. Productive cough. Elevated white blood cell count. EXAM: CT CHEST WITHOUT CONTRAST TECHNIQUE: Multidetector CT imaging of the chest was performed following the standard protocol without IV contrast. COMPARISON:  Chest radiograph of earlier today and 10/19/2015. FINDINGS: Mediastinum/Nodes: Mildly prominent left supraclavicular/ low jugular node, including at 9 mm on image 5/series 2. Aortic and branch vessel atherosclerosis. Mild cardiomegaly with multivessel coronary artery atherosclerosis. Borderline enlarged precarinal node at 10 mm. Moderate to large hiatal hernia, with greater than 1/2 of the stomach positioned within the chest. Lungs/Pleura: Small left pleural effusion. Mild subsegmental atelectasis at the right lung base. Scattered pulmonary nodules measuring maximally 4 mm, including on image 63/series 3. Left lower lobe consolidation dependently. Patchy areas of ground-glass opacity in the left lower lobe and left upper lobe. These areas demonstrate concurrent bronchiectasis, including in the left upper lobe on image 64/series 3. Upper abdomen: Cholecystectomy. Normal imaged portions of the liver, spleen, pancreas, adrenal glands. Both kidneys demonstrate primarily fluid density  lesions which are likely cysts. Underlying renal cortical thinning. An upper pole posterior exophytic left renal mass measures 6.1 by 6.3 cm on image 53/series 2. Heterogeneously hyper attenuating. An adjacent interpolar left renal lesion measures 2.7 cm and is more homogeneously hyper attenuating. Musculoskeletal: No acute osseous abnormality. IMPRESSION: 1. Left lower lobe and less so left upper lobe areas of airspace and ground-glass opacity. Given chronic  opacity especially in the left lower lobe on plain film and the concurrent areas of bronchiectasis, a component of these findings are likely chronic. However, given the clinical history and the appearance of the left lung base, acute superimposed infection or aspiration is a concern. 2. Moderate to large hiatal hernia, which would predispose the patient to aspiration. 3. Small left pleural effusion. 4. Multiple bilateral renal masses. Majority are felt to represent cysts. A left renal lesion is hyperattenuating and heterogeneous, suspicious for a solid neoplasm. Complex cyst could look similar. An adjacent left renal lesion is indeterminate. Given patient age, this is of questionable clinical significance. If further evaluation is desired, an initial evaluation with ultrasound suggested. 5.  Atherosclerosis, including within the coronary arteries. 6. Scattered pulmonary nodules, indeterminate and also of questionable clinical significance given patient age. 7. Minimally enlarged left supraclavicular/low jugular node. This could be reactive or related to metastatic disease from the patient's abdominal malignancy. Electronically Signed   By: Jeronimo Greaves M.D.   On: 02/09/2016 16:38   Dg Chest Portable 1 View  02/09/2016  CLINICAL DATA:  Weakness.  Febrile. EXAM: PORTABLE CHEST 1 VIEW COMPARISON:  10/19/2015 FINDINGS: Midline trachea. Mild cardiomegaly with a tortuous thoracic aorta. A moderate hiatal hernia. No pleural effusion or pneumothorax. Given differences in technique, left base opacity is similar and favored to represent scarring. No consolidation identified. IMPRESSION: No acute cardiopulmonary disease. Cardiomegaly without congestive failure. Hiatal hernia. Electronically Signed   By: Jeronimo Greaves M.D.   On: 02/09/2016 13:31    EKG: Orders placed or performed during the hospital encounter of 02/09/16  . ED EKG  . ED EKG  . EKG 12-Lead  . EKG 12-Lead    IMPRESSION AND PLAN:  * Pneumonia   IV  ceftriaxone and oral doxycycline.   Check MRSA screen and would be able to taper down antibiotic to oral prednisone.  * Generalized weakness and fall   Get physical therapy evaluation. He may need..  * Chronic kidney disease stage 4   Stable, continue monitoring.  * Hypertension   Continue hydralazine   All the records are reviewed and case discussed with ED provider. Management plans discussed with the patient, family and they are in agreement.  CODE STATUS:DO NOT RESUSCITATE  Code Status History    Date Active Date Inactive Code Status Order ID Comments User Context   06/29/2015  9:33 PM 07/02/2015  4:25 PM DNR [id number]  Wyatt Haste, MD ED   06/29/2015  8:40 PM 06/29/2015  9:33 PM Full Code [id number]  Wyatt Haste, MD ED    Questions for Most Recent Historical Code Status (Order [id number])    Question Answer Comment   In the event of cardiac or respiratory ARREST Do not call a "code blue"    In the event of cardiac or respiratory ARREST Do not perform Intubation, CPR, defibrillation or ACLS    In the event of cardiac or respiratory ARREST Use medication by any route, position, wound care, and other measures to relive pain and suffering. May use oxygen, suction and manual treatment of  airway obstruction as needed for comfort.      Discussed with patient's 2 daughters in the room.  TOTAL TIME TAKING CARE OF THIS PATIENT: 50  minutes.    Altamese Dilling M.D on 02/09/2016   Between 7am to 6pm - Pager - [phone]  After 6pm go to [email or web address] - password [email or web address]  [facility]  Office  [phone]  CC: Primary care physician; Wynona Dove, MD   Note: This dictation was prepared with Dragon dictation along with smaller phrase technology. Any transcriptional errors that result from this process are unintentional.

## 2016-02-09 NOTE — ED Notes (Signed)
Family concerned because pt hadn't peed since earlier this morning. Despite the fact that pt had had little PO fluid intake and hadn't had any IV fluids, and pt has hx of low kidney function, this RN bladder scanned pt to see if he was retaining urine. Pt had of urine in bladder. Pt reports no urge to pee. Hospitalist notified and verbal order received for foley catheter.

## 2016-02-10 LAB — BASIC METABOLIC PANEL
Anion gap: 8 (ref 5–15)
BUN: 37 mg/dL — AB (ref 6–20)
CHLORIDE: 113 mmol/L — AB (ref 101–111)
CO2: 20 mmol/L — ABNORMAL LOW (ref 22–32)
Calcium: 8.4 mg/dL — ABNORMAL LOW (ref 8.9–10.3)
Creatinine, Ser: 2.16 mg/dL — ABNORMAL HIGH (ref 0.61–1.24)
GFR calc Af Amer: 27 mL/min — ABNORMAL LOW (ref >60)
GFR, EST NON AFRICAN AMERICAN: 23 mL/min — AB (ref >60)
GLUCOSE: 87 mg/dL (ref 65–99)
POTASSIUM: 3.9 mmol/L (ref 3.5–5.1)
Sodium: 141 mmol/L (ref 135–145)

## 2016-02-10 LAB — CBC
HEMATOCRIT: 26.3 % — AB (ref 40.0–52.0)
Hemoglobin: 8.7 g/dL — ABNORMAL LOW (ref 13.0–18.0)
MCH: 29.8 pg (ref 26.0–34.0)
MCHC: 33 g/dL (ref 32.0–36.0)
MCV: 90.2 fL (ref 80.0–100.0)
Platelets: 138 10*3/uL — ABNORMAL LOW (ref 150–440)
RBC: 2.92 MIL/uL — ABNORMAL LOW (ref 4.40–5.90)
RDW: 15.6 % — AB (ref 11.5–14.5)
WBC: 7.8 10*3/uL (ref 3.8–10.6)

## 2016-02-10 MED ORDER — HALOPERIDOL LACTATE 5 MG/ML IJ SOLN
1.0000 mg | Freq: Once | INTRAMUSCULAR | Status: AC
  Administered 2016-02-10: 1 mg via INTRAVENOUS
  Filled 2016-02-10: qty 1

## 2016-02-10 MED ORDER — PREDNISONE 50 MG PO TABS
50.0000 mg | ORAL_TABLET | Freq: Every day | ORAL | Status: DC
  Administered 2016-02-10 – 2016-02-12 (×3): 50 mg via ORAL
  Filled 2016-02-10 (×3): qty 1

## 2016-02-10 NOTE — Progress Notes (Signed)
[facility] [place] [facility]   PATIENT NAME: [name]    MR#:  [id number]  DATE OF BIRTH:  05/09/1911  SUBJECTIVE:  CHIEF COMPLAINT:   Chief Complaint  Patient presents with  . Weakness   Continues to feel weak. Cough with yellow sputum. No chest pain.  REVIEW OF SYSTEMS:    Review of Systems  Constitutional: Positive for malaise/fatigue.  Respiratory: Positive for cough, sputum production, shortness of breath and wheezing.   Cardiovascular: Negative for chest pain.  Gastrointestinal: Negative for vomiting and diarrhea.  Musculoskeletal: Positive for back pain.  Neurological: Positive for weakness.    DRUG ALLERGIES:   Allergies  Allergen Reactions  . Amoxicillin Other (See Comments)    Reaction:  Unknown   . Clarithromycin Other (See Comments)    Reaction:  Unknown   . Influenza Vaccines Other (See Comments)    Reaction:  Unknown   . Levofloxacin Other (See Comments)    Reaction:  Unknown   . Macrodantin Other (See Comments)    Reaction:  Unknown   . Metronidazole Other (See Comments)    Reaction:  Unknown   . Micardis [Telmisartan] Other (See Comments)    Reaction:  Unknown   . Nabumetone Other (See Comments)    Reaction:  Unknown   . Neosporin [Neomycin-Polymyxin-Gramicidin] Other (See Comments)    Reaction:  Unknown   . Penicillins Other (See Comments)    Reaction:  Unknown   . Sulfa Drugs Cross Reactors Other (See Comments)    Reaction:  Unknown   . Zithromax [Azithromycin] Diarrhea and Other (See Comments)    Reaction:  Dizziness and headaches    VITALS:  Blood pressure 141/52, pulse 66, temperature 98.4 F (36.9 C), temperature source Oral, resp. rate 18, height 5\' 8"  (1.727 m), weight 71.94 kg (158 lb 9.6 oz), SpO2 98 %.  PHYSICAL EXAMINATION:   Physical Exam  GENERAL:  [age] y.o.-year-old patient lying in the bed with no acute distress.  EYES: Pupils equal, round, reactive to light and accommodation. No scleral  icterus. Extraocular muscles intact.  HEENT: Head atraumatic, normocephalic. Oropharynx and nasopharynx clear.  NECK:  Supple, no jugular venous distention. No thyroid enlargement, no tenderness.  LUNGS: Bilateral wheezing. Good air entry. CARDIOVASCULAR: S1, S2 normal. No murmurs, rubs, or gallops.  ABDOMEN: Soft, nontender, nondistended. Bowel sounds present. No organomegaly or mass.  EXTREMITIES: No cyanosis, clubbing or edema b/l.    NEUROLOGIC: Cranial nerves II through XII are intact. No focal Motor or sensory deficits b/l.   PSYCHIATRIC: The patient is alert and awake SKIN: No obvious rash, lesion, or ulcer.   LABORATORY PANEL:   CBC  Recent Labs Lab 02/10/16 0504  WBC 7.8  HGB 8.7*  HCT 26.3*  PLT 138*   ------------------------------------------------------------------------------------------------------------------ Chemistries   Recent Labs Lab 02/09/16 1246 02/10/16 0504  NA 140 141  K 4.3 3.9  CL 114* 113*  CO2 20* 20*  GLUCOSE 115* 87  BUN 40* 37*  CREATININE 2.27* 2.16*  CALCIUM 8.4* 8.4*  AST 27  --   ALT 16*  --   ALKPHOS 58  --   BILITOT 0.7  --    ------------------------------------------------------------------------------------------------------------------  Cardiac Enzymes  Recent Labs Lab 02/09/16 2100  TROPONINI 0.11*   ------------------------------------------------------------------------------------------------------------------  RADIOLOGY:  Ct Head Wo Contrast  02/09/2016  CLINICAL DATA:  Lethargy following fall EXAM: CT HEAD WITHOUT CONTRAST TECHNIQUE: Contiguous axial images were obtained from the base of the skull through the vertex without  intravenous contrast. COMPARISON:  March 07, 2011 FINDINGS: There is moderate diffuse atrophy. There is no intracranial mass, hemorrhage, extra-axial fluid collection, or midline shift. There is patchy small vessel disease in the centra semiovale bilaterally. There is a small lacunar infarct in  the anterior left lentiform nucleus, stable. There is small vessel disease in the right external capsule. There is a prior infarct in the upper left cerebellum. No acute appearing infarct is identified. The bony calvarium appears intact. Visualized mastoid air cells are clear. Orbits appear symmetric bilaterally. There is mucosal thickening in the visualized left maxillary antrum. There is also mucosal thickening in a left-sided ethmoid air cell. There is calcification in the cavernous carotid artery regions bilaterally. IMPRESSION: Atrophy with small vessel disease and prior small infarcts as noted above. No acute infarct evident. No hemorrhage or mass effect. No extra-axial fluid collection or midline shift. Areas of paranasal sinus disease. Electronically Signed   By: Bretta Bang III M.D.   On: 02/09/2016 13:44   Ct Chest Wo Contrast  02/09/2016  CLINICAL DATA:  Weakness. Productive cough. Elevated white blood cell count. EXAM: CT CHEST WITHOUT CONTRAST TECHNIQUE: Multidetector CT imaging of the chest was performed following the standard protocol without IV contrast. COMPARISON:  Chest radiograph of earlier today and 10/19/2015. FINDINGS: Mediastinum/Nodes: Mildly prominent left supraclavicular/ low jugular node, including at 9 mm on image 5/series 2. Aortic and branch vessel atherosclerosis. Mild cardiomegaly with multivessel coronary artery atherosclerosis. Borderline enlarged precarinal node at 10 mm. Moderate to large hiatal hernia, with greater than 1/2 of the stomach positioned within the chest. Lungs/Pleura: Small left pleural effusion. Mild subsegmental atelectasis at the right lung base. Scattered pulmonary nodules measuring maximally 4 mm, including on image 63/series 3. Left lower lobe consolidation dependently. Patchy areas of ground-glass opacity in the left lower lobe and left upper lobe. These areas demonstrate concurrent bronchiectasis, including in the left upper lobe on image 64/series  3. Upper abdomen: Cholecystectomy. Normal imaged portions of the liver, spleen, pancreas, adrenal glands. Both kidneys demonstrate primarily fluid density lesions which are likely cysts. Underlying renal cortical thinning. An upper pole posterior exophytic left renal mass measures 6.1 by 6.3 cm on image 53/series 2. Heterogeneously hyper attenuating. An adjacent interpolar left renal lesion measures 2.7 cm and is more homogeneously hyper attenuating. Musculoskeletal: No acute osseous abnormality. IMPRESSION: 1. Left lower lobe and less so left upper lobe areas of airspace and ground-glass opacity. Given chronic opacity especially in the left lower lobe on plain film and the concurrent areas of bronchiectasis, a component of these findings are likely chronic. However, given the clinical history and the appearance of the left lung base, acute superimposed infection or aspiration is a concern. 2. Moderate to large hiatal hernia, which would predispose the patient to aspiration. 3. Small left pleural effusion. 4. Multiple bilateral renal masses. Majority are felt to represent cysts. A left renal lesion is hyperattenuating and heterogeneous, suspicious for a solid neoplasm. Complex cyst could look similar. An adjacent left renal lesion is indeterminate. Given patient age, this is of questionable clinical significance. If further evaluation is desired, an initial evaluation with ultrasound suggested. 5.  Atherosclerosis, including within the coronary arteries. 6. Scattered pulmonary nodules, indeterminate and also of questionable clinical significance given patient age. 7. Minimally enlarged left supraclavicular/low jugular node. This could be reactive or related to metastatic disease from the patient's abdominal malignancy. Electronically Signed   By: Jeronimo Greaves M.D.   On: 02/09/2016 16:38   Dg Chest  Portable 1 View  02/09/2016  CLINICAL DATA:  Weakness.  Febrile. EXAM: PORTABLE CHEST 1 VIEW COMPARISON:  10/19/2015  FINDINGS: Midline trachea. Mild cardiomegaly with a tortuous thoracic aorta. A moderate hiatal hernia. No pleural effusion or pneumothorax. Given differences in technique, left base opacity is similar and favored to represent scarring. No consolidation identified. IMPRESSION: No acute cardiopulmonary disease. Cardiomegaly without congestive failure. Hiatal hernia. Electronically Signed   By: Jeronimo Greaves M.D.   On: 02/09/2016 13:31     ASSESSMENT AND PLAN:   * Left community Acquired Pneumonia  IV ceftriaxone and oral doxycycline.  We'll start prednisone  * Elevated troponin, mild Stable on repeat check. Likely demand ischemia from pneumonia.  * Generalized weakness and fall PT consult  * Chronic kidney disease stage 4  Stable, continue monitoring.  * Hypertension  Continue hydralazine  * Anemia of chronic disease  All the records are reviewed and case discussed with Care Management/Social Workerr. Management plans discussed with the patient.  CODE STATUS: DNR  DVT Prophylaxis: SCDs  TOTAL TIME TAKING CARE OF THIS PATIENT: 30 minutes.   POSSIBLE D/C IN 1-2 DAYS, DEPENDING ON CLINICAL CONDITION.  Milagros Loll R M.D on 02/10/2016 at 10:43 AM  Between 7am to 6pm - Pager - [phone]  After 6pm go to [email or web address] - password [email or web address]  [facility]  Office  [phone]  CC: Primary care physician; Wynona Dove, MD  Note: This dictation was prepared with Dragon dictation along with smaller phrase technology. Any transcriptional errors that result from this process are unintentional.

## 2016-02-10 NOTE — Evaluation (Signed)
Physical Therapy Evaluation Patient Details Name: Pedro PlumberJohnie Allen [name] MRN: [id number] DOB: 04/23/1911 Today's Date: 02/10/2016   History of Present Illness  Pt is admitted for pneumonia. Pt with history CKD, syncopal episode, and HTN. Pt with complaints of weakness.  Clinical Impression  Pt is a pleasant 82 year old male who was admitted for pneumonia. Pt performs bed mobility, transfers, and ambulation with cga and rw. Pt fatigues quickly with increased distance. Pt needs cues to keep RW close to body. R LE > L LE strength. Pt demonstrates deficits with endurance/mobility. Would benefit from skilled PT to address above deficits and promote optimal return to PLOF. Recommend transition to HHPT upon discharge from acute hospitalization.       Follow Up Recommendations Home health PT    Equipment Recommendations       Recommendations for Other Services       Precautions / Restrictions Precautions Precautions: Fall Restrictions Weight Bearing Restrictions: No      Mobility  Bed Mobility Overal bed mobility: Needs Assistance Bed Mobility: Supine to Sit     Supine to sit: Min guard     General bed mobility comments: assist for bed mobility. Once seated at EOB, pt able to sit with supervision  Transfers Overall transfer level: Needs assistance Equipment used: Rolling walker (2 wheeled) Transfers: Sit to/from Stand Sit to Stand: Min guard         General transfer comment: safe technique performed with transfers. RW used. Upright posture noted  Ambulation/Gait Ambulation/Gait assistance: Min guard Ambulation Distance (Feet): 80 Feet Assistive device: Rolling walker (2 wheeled) Gait Pattern/deviations: Step-through pattern     General Gait Details: ambulated using rw and cga. Pt with decreased step length on L LE, needs cues to keep closer to rw to improve stability. Also needs cues for increased step length. Pt fatigues with increased distance this session. All mobility  performed with pt on room air with sats at 95% with exertion.  Stairs            Wheelchair Mobility    Modified Rankin (Stroke Patients Only)       Balance Overall balance assessment: History of Falls;Needs assistance Sitting-balance support: Feet supported Sitting balance-Leahy Scale: Good     Standing balance support: Bilateral upper extremity supported Standing balance-Leahy Scale: Fair                               Pertinent Vitals/Pain Pain Assessment: No/denies pain    Home Living Family/patient expects to be discharged to:: Private residence Living Arrangements: Children Available Help at Discharge: Family;Available 24 hours/day Type of Home: House Home Access: Ramped entrance     Home Layout: One level Home Equipment: Walker - 4 wheels;Hospital bed;Wheelchair - manual Additional Comments: family has all equipment needed    Prior Function Level of Independence: Independent with assistive device(s)         Comments: uses rollater for all mobility     Hand Dominance        Extremity/Trunk Assessment   Upper Extremity Assessment: Overall WFL for tasks assessed           Lower Extremity Assessment: Generalized weakness (L LE grossly 3+/5; R LE grossly 4/5)         Communication   Communication: Expressive difficulties  Cognition Arousal/Alertness: Awake/alert Behavior During Therapy: WFL for tasks assessed/performed Overall Cognitive Status: Within Functional Limits for tasks assessed  General Comments      Exercises        Assessment/Plan    PT Assessment Patient needs continued PT services  PT Diagnosis Difficulty walking;Generalized weakness   PT Problem List Decreased strength;Decreased balance;Decreased mobility  PT Treatment Interventions DME instruction;Gait training;Therapeutic exercise   PT Goals (Current goals can be found in the Care Plan section) Acute Rehab PT  Goals Patient Stated Goal: to go back home PT Goal Formulation: With patient Time For Goal Achievement: 02/24/16 Potential to Achieve Goals: Good    Frequency Min 2X/week   Barriers to discharge        Co-evaluation               End of Session Equipment Utilized During Treatment: Gait belt Activity Tolerance: Patient tolerated treatment well Patient left: in bed;with bed alarm set;with nursing/sitter in room;with family/visitor present Nurse Communication: Mobility status         Time: 4098-1191 PT Time Calculation (min) (ACUTE ONLY): 19 min   Charges:   PT Evaluation $PT Eval Moderate Complexity: 1 Procedure     PT G Codes:        Erwin Nishiyama 02/11/16, 11:46 AM Elizabeth Palau, PT, DPT [phone]

## 2016-02-10 NOTE — Care Management (Signed)
Patient admitted for PNA.  Patient lives in his own home.  Patient has 4 children.  Children rotate staying with the patient and someone is with him 24 hours a day.  Patient ambulates with a rollator in the home. Daughter at the bedside states that there is additional equipment in the home that belonged to their mother should they need it.  Patient obtains medications from [facility] in [place].  Daughter's provide transportation.  PT has recommended home health PT.  Agency list provided to daughter.  Stated that patient has been open with [facility] in the past and would like to use them again.  I have contacted Cherl with [facility] and given a heads up referral.  RNCM following for discharge planning.

## 2016-02-11 ENCOUNTER — Inpatient Hospital Stay: Payer: Medicare Other

## 2016-02-11 MED ORDER — HALOPERIDOL LACTATE 5 MG/ML IJ SOLN
3.0000 mg | Freq: Once | INTRAMUSCULAR | Status: AC
  Administered 2016-02-11: 3 mg via INTRAVENOUS
  Filled 2016-02-11: qty 1

## 2016-02-11 MED ORDER — BISACODYL 5 MG PO TBEC
10.0000 mg | DELAYED_RELEASE_TABLET | Freq: Every day | ORAL | Status: DC
  Administered 2016-02-11: 10 mg via ORAL
  Filled 2016-02-11: qty 2

## 2016-02-11 MED ORDER — FLEET ENEMA 7-19 GM/118ML RE ENEM: 1.0000 | ENEMA | Freq: Every day | RECTAL | Status: DC | PRN

## 2016-02-11 MED ORDER — ALPRAZOLAM 0.25 MG PO TABS
0.2500 mg | ORAL_TABLET | Freq: Every evening | ORAL | Status: DC | PRN
  Administered 2016-02-11: 0.25 mg via ORAL
  Filled 2016-02-11: qty 1

## 2016-02-11 MED ORDER — ENSURE ENLIVE PO LIQD
237.0000 mL | Freq: Three times a day (TID) | ORAL | Status: DC
  Administered 2016-02-11 (×3): 237 mL via ORAL

## 2016-02-11 MED ORDER — POLYETHYLENE GLYCOL 3350 17 G PO PACK
17.0000 g | PACK | Freq: Once | ORAL | Status: AC
  Administered 2016-02-11: 17 g via ORAL
  Filled 2016-02-11: qty 1

## 2016-02-11 NOTE — Progress Notes (Signed)
Patient has become confused. Pulling leads and clothes off. Attempting to pull out catheter. Attempting to get out of bed without assistance multiple times. Redirected. Md notified and witnessed. Medication given. Green safety mitts applied. Will continue to monitor.

## 2016-02-11 NOTE — Progress Notes (Signed)
Per daughter, patient drinks boost at home. Verbal order received from Dr. Elpidio AnisSudini to order ensure, and also to remove foley. Trudee KusterBrandi R [place]

## 2016-02-11 NOTE — Progress Notes (Signed)
[facility] - [place] at [facility]   PATIENT NAME: [name]    MR#:  [id number]  DATE OF BIRTH:  [birth year]  SUBJECTIVE:  CHIEF COMPLAINT:   Chief Complaint  Patient presents with  . Weakness   Continues to feel weak. Cough with yellow sputum. No chest pain. Confused overnight and needed Haldol.  REVIEW OF SYSTEMS:    Review of Systems  Constitutional: Positive for malaise/fatigue.  Respiratory: Positive for cough, sputum production, shortness of breath and wheezing.   Cardiovascular: Negative for chest pain.  Gastrointestinal: Negative for vomiting and diarrhea.  Musculoskeletal: Positive for back pain.  Neurological: Positive for weakness.    DRUG ALLERGIES:   Allergies  Allergen Reactions  . Amoxicillin Other (See Comments)    Reaction:  Unknown   . Clarithromycin Other (See Comments)    Reaction:  Unknown   . Influenza Vaccines Other (See Comments)    Reaction:  Unknown   . Levofloxacin Other (See Comments)    Reaction:  Unknown   . Macrodantin Other (See Comments)    Reaction:  Unknown   . Metronidazole Other (See Comments)    Reaction:  Unknown   . Micardis [Telmisartan] Other (See Comments)    Reaction:  Unknown   . Nabumetone Other (See Comments)    Reaction:  Unknown   . Neosporin [Neomycin-Polymyxin-Gramicidin] Other (See Comments)    Reaction:  Unknown   . Penicillins Other (See Comments)    Reaction:  Unknown   . Sulfa Drugs Cross Reactors Other (See Comments)    Reaction:  Unknown   . Zithromax [Azithromycin] Diarrhea and Other (See Comments)    Reaction:  Dizziness and headaches    VITALS:  Blood pressure 134/84, pulse 87, temperature 97.5 F (36.4 C), temperature source Oral, resp. rate 18, height 5\' 8"  (1.727 m), weight 71.94 kg (158 lb 9.6 oz), SpO2 97 %.  PHYSICAL EXAMINATION:   Physical Exam  GENERAL:  82 y.o.-year-old patient lying in the bed with Mild respiratory distress EYES: Pupils equal, round,  reactive to light and accommodation. No scleral icterus. Extraocular muscles intact.  HEENT: Head atraumatic, normocephalic. Oropharynx and nasopharynx clear.  NECK:  Supple, no jugular venous distention. No thyroid enlargement, no tenderness.  LUNGS: Bilateral wheezing. Good air entry. CARDIOVASCULAR: S1, S2 normal. No murmurs, rubs, or gallops.  ABDOMEN: Soft, nontender, nondistended. Bowel sounds present. No organomegaly or mass.  EXTREMITIES: No cyanosis, clubbing or edema b/l.    NEUROLOGIC: Cranial nerves II through XII are intact. No focal Motor or sensory deficits b/l.   PSYCHIATRIC: The patient is alert and awake. Confused SKIN: No obvious rash, lesion, or ulcer.   LABORATORY PANEL:   CBC  Recent Labs Lab 02/10/16 0504  WBC 7.8  HGB 8.7*  HCT 26.3*  PLT 138*   ------------------------------------------------------------------------------------------------------------------ Chemistries   Recent Labs Lab 02/09/16 1246 02/10/16 0504  NA 140 141  K 4.3 3.9  CL 114* 113*  CO2 20* 20*  GLUCOSE 115* 87  BUN 40* 37*  CREATININE 2.27* 2.16*  CALCIUM 8.4* 8.4*  AST 27  --   ALT 16*  --   ALKPHOS 58  --   BILITOT 0.7  --    ------------------------------------------------------------------------------------------------------------------  Cardiac Enzymes  Recent Labs Lab 02/09/16 2100  TROPONINI 0.11*   ------------------------------------------------------------------------------------------------------------------  RADIOLOGY:  Ct Head Wo Contrast  02/09/2016  CLINICAL DATA:  Lethargy following fall EXAM: CT HEAD WITHOUT CONTRAST TECHNIQUE: Contiguous axial images were obtained from the base of the  skull through the vertex without intravenous contrast. COMPARISON:  March 07, 2011 FINDINGS: There is moderate diffuse atrophy. There is no intracranial mass, hemorrhage, extra-axial fluid collection, or midline shift. There is patchy small vessel disease in the centra  semiovale bilaterally. There is a small lacunar infarct in the anterior left lentiform nucleus, stable. There is small vessel disease in the right external capsule. There is a prior infarct in the upper left cerebellum. No acute appearing infarct is identified. The bony calvarium appears intact. Visualized mastoid air cells are clear. Orbits appear symmetric bilaterally. There is mucosal thickening in the visualized left maxillary antrum. There is also mucosal thickening in a left-sided ethmoid air cell. There is calcification in the cavernous carotid artery regions bilaterally. IMPRESSION: Atrophy with small vessel disease and prior small infarcts as noted above. No acute infarct evident. No hemorrhage or mass effect. No extra-axial fluid collection or midline shift. Areas of paranasal sinus disease. Electronically Signed   By: Bretta BangWilliam  Woodruff III M.D.   On: 02/09/2016 13:44   Ct Chest Wo Contrast  02/09/2016  CLINICAL DATA:  Weakness. Productive cough. Elevated white blood cell count. EXAM: CT CHEST WITHOUT CONTRAST TECHNIQUE: Multidetector CT imaging of the chest was performed following the standard protocol without IV contrast. COMPARISON:  Chest radiograph of earlier today and 10/19/2015. FINDINGS: Mediastinum/Nodes: Mildly prominent left supraclavicular/ low jugular node, including at 9 mm on image 5/series 2. Aortic and branch vessel atherosclerosis. Mild cardiomegaly with multivessel coronary artery atherosclerosis. Borderline enlarged precarinal node at 10 mm. Moderate to large hiatal hernia, with greater than 1/2 of the stomach positioned within the chest. Lungs/Pleura: Small left pleural effusion. Mild subsegmental atelectasis at the right lung base. Scattered pulmonary nodules measuring maximally 4 mm, including on image 63/series 3. Left lower lobe consolidation dependently. Patchy areas of ground-glass opacity in the left lower lobe and left upper lobe. These areas demonstrate concurrent  bronchiectasis, including in the left upper lobe on image 64/series 3. Upper abdomen: Cholecystectomy. Normal imaged portions of the liver, spleen, pancreas, adrenal glands. Both kidneys demonstrate primarily fluid density lesions which are likely cysts. Underlying renal cortical thinning. An upper pole posterior exophytic left renal mass measures 6.1 by 6.3 cm on image 53/series 2. Heterogeneously hyper attenuating. An adjacent interpolar left renal lesion measures 2.7 cm and is more homogeneously hyper attenuating. Musculoskeletal: No acute osseous abnormality. IMPRESSION: 1. Left lower lobe and less so left upper lobe areas of airspace and ground-glass opacity. Given chronic opacity especially in the left lower lobe on plain film and the concurrent areas of bronchiectasis, a component of these findings are likely chronic. However, given the clinical history and the appearance of the left lung base, acute superimposed infection or aspiration is a concern. 2. Moderate to large hiatal hernia, which would predispose the patient to aspiration. 3. Small left pleural effusion. 4. Multiple bilateral renal masses. Majority are felt to represent cysts. A left renal lesion is hyperattenuating and heterogeneous, suspicious for a solid neoplasm. Complex cyst could look similar. An adjacent left renal lesion is indeterminate. Given patient age, this is of questionable clinical significance. If further evaluation is desired, an initial evaluation with ultrasound suggested. 5.  Atherosclerosis, including within the coronary arteries. 6. Scattered pulmonary nodules, indeterminate and also of questionable clinical significance given patient age. 7. Minimally enlarged left supraclavicular/low jugular node. This could be reactive or related to metastatic disease from the patient's abdominal malignancy. Electronically Signed   By: Jeronimo GreavesKyle  Talbot M.D.   On: 02/09/2016  16:38   Dg Chest Port 1 View  02/11/2016  CLINICAL DATA:  Shortness  of breath onset tonight EXAM: PORTABLE CHEST 1 VIEW COMPARISON:  CT of the chest from 2 days ago FINDINGS: Now bilateral and increased perihilar interstitial and airspace opacity. No convincing or significant pleural effusion. No pneumothorax. No cardiomegaly when accounting for large hiatal hernia. IMPRESSION: Increased perihilar edema or pneumonia. Electronically Signed   By: Marnee Spring M.D.   On: 02/11/2016 04:57   Dg Chest Portable 1 View  02/09/2016  CLINICAL DATA:  Weakness.  Febrile. EXAM: PORTABLE CHEST 1 VIEW COMPARISON:  10/19/2015 FINDINGS: Midline trachea. Mild cardiomegaly with a tortuous thoracic aorta. A moderate hiatal hernia. No pleural effusion or pneumothorax. Given differences in technique, left base opacity is similar and favored to represent scarring. No consolidation identified. IMPRESSION: No acute cardiopulmonary disease. Cardiomegaly without congestive failure. Hiatal hernia. Electronically Signed   By: Jeronimo Greaves M.D.   On: 02/09/2016 13:31     ASSESSMENT AND PLAN:   * Left community Acquired Pneumonia  IV ceftriaxone and oral doxycycline.  Started on prednisone yesterday due to wheezing. Chest x-ray shows some worsening. Continue inpatient treatment with IV antibiotics 1 more day.  * Elevated troponin, mild Stable on repeat check. Likely demand ischemia from pneumonia.  * Generalized weakness and fall PT consult  * Chronic kidney disease stage 4  Stable, continue monitoring.  * Hypertension  Continue hydralazine  * Anemia of chronic disease  * Inpatient delirium over dementia  All the records are reviewed and case discussed with Care Management/Social Workerr. Management plans discussed with the patient.  CODE STATUS: DNR  DVT Prophylaxis: SCDs  TOTAL TIME TAKING CARE OF THIS PATIENT: 30 minutes.   POSSIBLE D/C IN 1-2 DAYS, DEPENDING ON CLINICAL CONDITION.  Milagros Loll R M.D on 02/11/2016 at 11:32 AM  Between 7am to 6pm - Pager -  [phone]  After 6pm go to [email or web address] - password [email or web address]  [facility]  Office  [phone]  CC: Primary care physician; Wynona Dove, MD  Note: This dictation was prepared with Dragon dictation along with smaller phrase technology. Any transcriptional errors that result from this process are unintentional.

## 2016-02-11 NOTE — Progress Notes (Signed)
Patient's daughter notified nurse that patient coughed up blood twice (scant amount). Dr. Elpidio AnisSudini notified, heparin d/c'd. Trudee KusterBrandi R Mansfield

## 2016-02-12 LAB — CBC WITH DIFFERENTIAL/PLATELET
BASOS ABS: 0 10*3/uL (ref 0–0.1)
EOS ABS: 0 10*3/uL (ref 0–0.7)
HCT: 28.7 % — ABNORMAL LOW (ref 40.0–52.0)
Hemoglobin: 9.6 g/dL — ABNORMAL LOW (ref 13.0–18.0)
Lymphs Abs: 0.7 10*3/uL — ABNORMAL LOW (ref 1.0–3.6)
MCH: 29.8 pg (ref 26.0–34.0)
MCHC: 33.3 g/dL (ref 32.0–36.0)
MCV: 89.4 fL (ref 80.0–100.0)
MONO ABS: 0.5 10*3/uL (ref 0.2–1.0)
Monocytes Relative: 5 %
Neutro Abs: 8.2 10*3/uL — ABNORMAL HIGH (ref 1.4–6.5)
Neutrophils Relative %: 88 %
PLATELETS: 193 10*3/uL (ref 150–440)
RBC: 3.2 MIL/uL — AB (ref 4.40–5.90)
RDW: 15.3 % — AB (ref 11.5–14.5)
WBC: 9.4 10*3/uL (ref 3.8–10.6)

## 2016-02-12 LAB — COMPREHENSIVE METABOLIC PANEL
ALBUMIN: 3.4 g/dL — AB (ref 3.5–5.0)
ALT: 16 U/L — ABNORMAL LOW (ref 17–63)
AST: 29 U/L (ref 15–41)
Alkaline Phosphatase: 60 U/L (ref 38–126)
Anion gap: 6 (ref 5–15)
BUN: 42 mg/dL — AB (ref 6–20)
CHLORIDE: 113 mmol/L — AB (ref 101–111)
CO2: 20 mmol/L — AB (ref 22–32)
CREATININE: 1.98 mg/dL — AB (ref 0.61–1.24)
Calcium: 9.1 mg/dL (ref 8.9–10.3)
GFR calc Af Amer: 30 mL/min — ABNORMAL LOW (ref >60)
GFR, EST NON AFRICAN AMERICAN: 26 mL/min — AB (ref >60)
Glucose, Bld: 113 mg/dL — ABNORMAL HIGH (ref 65–99)
POTASSIUM: 3.9 mmol/L (ref 3.5–5.1)
SODIUM: 139 mmol/L (ref 135–145)
Total Bilirubin: 0.5 mg/dL (ref 0.3–1.2)
Total Protein: 6.3 g/dL — ABNORMAL LOW (ref 6.5–8.1)

## 2016-02-12 LAB — TSH: TSH: 3.441 u[IU]/mL (ref 0.350–4.500)

## 2016-02-12 LAB — MAGNESIUM: MAGNESIUM: 2 mg/dL (ref 1.7–2.4)

## 2016-02-12 MED ORDER — ALPRAZOLAM 0.5 MG PO TABS: 0.5000 mg | ORAL_TABLET | Freq: Every evening | ORAL | Status: DC | PRN

## 2016-02-12 MED ORDER — DOXYCYCLINE HYCLATE 100 MG PO TABS: 100.0000 mg | ORAL_TABLET | Freq: Two times a day (BID) | ORAL | Status: AC

## 2016-02-12 MED ORDER — PREDNISONE 20 MG PO TABS: 40.0000 mg | ORAL_TABLET | Freq: Every day | ORAL | Status: AC

## 2016-02-12 NOTE — Discharge Summary (Signed)
[facility] - [place] at [facility]   PATIENT NAME: [name]    MR#:  [id number]  DATE OF BIRTH:  05/10/1911  DATE OF ADMISSION:  02/09/2016 ADMITTING PHYSICIAN: Altamese DillingVaibhavkumar Vachhani, MD  DATE OF DISCHARGE: 02/12/2016  1:38 PM  PRIMARY CARE PHYSICIAN: Wynona DoveWALKER,JENNIFER AZBELL, MD   ADMISSION DIAGNOSIS:  Community acquired pneumonia [J18.9] Elevated troponin [R79.89] Fall, initial encounter [W19.XXXA]  DISCHARGE DIAGNOSIS:  Principal Problem:   Pneumonia   SECONDARY DIAGNOSIS:   Past Medical History  Diagnosis Date  . Chronic kidney disease   . Syncope and collapse June 2012    pre syncopal  . Hypertension      ADMITTING HISTORY   HISTORY OF PRESENT ILLNESS: Pedro CageJohnie Urton is a [age] y.o. male with a known history of Chronic kidney disease, syncopal episode, hypertension, pneumonia in the past, lives at home and his 4 daughters take turns to take care of him at home. Until last night he was at his baseline. He has some hearing deficit so all this history obtained from 2 of his daughters were present in the room in the emergency room.  Today morning when he woke up he was not very steady on his legs and daughters had to hold him and pushed him with his walker to go from one room to the other.  He could not stand up steadily and was falling down and daughters had to keep him holding for that, concerned with this that thought he is running some infection and they brought him to the emergency room. In ER he was noted to have pneumonia and started on antibiotic and given his admission to hospitalist team. 7 days ago patient again had a fall when he injured his right elbow and he has some stitches over there taken in ER.  HOSPITAL COURSE:   * Left community Acquired Pneumonia  IV ceftriaxone and oral doxycycline.  Started on prednisone yesterday due to wheezing. Chest x-ray shows some worsening.  * Elevated troponin, mild Stable on repeat  check. Likely demand ischemia from pneumonia.  * Generalized weakness and fall  * Chronic kidney disease stage 4  Stable, continue monitoring.  * Hypertension  Continue hydralazine  * Anemia of chronic disease  * Inpatient delirium over dementia  * Sinus bradycardia Daughter did not want any pacemaker or aggressive care.  Patient has not improved well in spite of aggressive treatment. He has had some increased confusion in the hospital over his mild cognitive impairment. Discussed with daughter who is his healthcare power of attorney. Due to worsening condition patient is being discharged home with hospice services. He is being continued on oral antibiotics and some steroids hoping he will improve. Overall has poor prognosis.  Discharged home with hospice services.  CONSULTS OBTAINED:  Treatment Team:  Almond LintAileen Ingal, MD  DRUG ALLERGIES:   Allergies  Allergen Reactions  . Amoxicillin Other (See Comments)    Reaction:  Unknown   . Clarithromycin Other (See Comments)    Reaction:  Unknown   . Influenza Vaccines Other (See Comments)    Reaction:  Unknown   . Levofloxacin Other (See Comments)    Reaction:  Unknown   . Macrodantin Other (See Comments)    Reaction:  Unknown   . Metronidazole Other (See Comments)    Reaction:  Unknown   . Micardis [Telmisartan] Other (See Comments)    Reaction:  Unknown   . Nabumetone Other (See Comments)    Reaction:  Unknown   .  Neosporin [Neomycin-Polymyxin-Gramicidin] Other (See Comments)    Reaction:  Unknown   . Penicillins Other (See Comments)    Reaction:  Unknown   . Sulfa Drugs Cross Reactors Other (See Comments)    Reaction:  Unknown   . Zithromax [Azithromycin] Diarrhea and Other (See Comments)    Reaction:  Dizziness and headaches    DISCHARGE MEDICATIONS:   Discharge Medication List as of 02/12/2016 12:11 PM    START taking these medications   Details  ALPRAZolam (XANAX) 0.5 MG tablet Take 1 tablet (0.5 mg total) by  mouth at bedtime as needed for anxiety., Starting 02/12/2016, Until Discontinued, Print    doxycycline (VIBRA-TABS) 100 MG tablet Take 1 tablet (100 mg total) by mouth 2 (two) times daily., Starting 02/12/2016, Until Discontinued, Normal    predniSONE (DELTASONE) 20 MG tablet Take 2 tablets (40 mg total) by mouth daily with breakfast., Starting 02/12/2016, Until Discontinued, Normal      CONTINUE these medications which have NOT CHANGED   Details  acidophilus (RISAQUAD) CAPS capsule Take 1 capsule by mouth daily., Until Discontinued, Historical Med    cyanocobalamin (,VITAMIN B-12,) 1000 MCG/ML injection Inject 1,000 mcg into the muscle every 30 (thirty) days., Until Discontinued, Historical Med    ferrous sulfate 325 (65 FE) MG EC tablet Take 325 mg by mouth daily., Until Discontinued, Historical Med    glucosamine-chondroitin 500-400 MG tablet Take 1 tablet by mouth 3 (three) times daily., Until Discontinued, Historical Med    hydrALAZINE (APRESOLINE) 25 MG tablet TAKE 1 TABLET FOUR TIMES A DAY, Normal    multivitamin-lutein (OCUVITE-LUTEIN) CAPS capsule Take 1 capsule by mouth daily., Until Discontinued, Historical Med    nystatin-triamcinolone ointment (MYCOLOG) Apply 1 application topically 2 (two) times daily as needed (for bed sores/ulcers)., Until Discontinued, Historical Med    omeprazole (PRILOSEC) 20 MG capsule Take 20 mg by mouth daily., Until Discontinued, Historical Med    potassium gluconate 595 (99 K) MG TABS tablet Take 595 mg by mouth., Until Discontinued, Historical Med        Today   VITAL SIGNS:  Blood pressure 127/48, pulse 70, temperature 97.8 F (36.6 C), temperature source Oral, resp. rate 18, height 5\' 8"  (1.727 m), weight 71.94 kg (158 lb 9.6 oz), SpO2 95 %.  I/O:   Intake/Output Summary (Last 24 hours) at 02/12/16 1421 Last data filed at 02/12/16 0830  Gross per 24 hour  Intake    340 ml  Output    325 ml  Net     15 ml    PHYSICAL EXAMINATION:   Physical Exam  GENERAL:  82 y.o.-year-old patient lying in the bed with Confusion LUNGS: Coarse breath sounds bilaterally with wheezing CARDIOVASCULAR: S1, S2 ABDOMEN: Soft, non-tender, non-distended. Bowel sounds present. No organomegaly or mass.  NEUROLOGIC: Moves all 4 extremities. SKIN: No obvious rash, lesion, or ulcer.   DATA REVIEW:   CBC  Recent Labs Lab 02/12/16 0853  WBC 9.4  HGB 9.6*  HCT 28.7*  PLT 193    Chemistries   Recent Labs Lab 02/12/16 0853  NA 139  K 3.9  CL 113*  CO2 20*  GLUCOSE 113*  BUN 42*  CREATININE 1.98*  CALCIUM 9.1  MG 2.0  AST 29  ALT 16*  ALKPHOS 60  BILITOT 0.5    Cardiac Enzymes  Recent Labs Lab 02/09/16 2100  TROPONINI 0.11*    Microbiology Results  Results for orders placed or performed during the hospital encounter of 02/09/16  Blood culture (  routine x 2)     Status: None (Preliminary result)   Collection Time: 02/09/16  5:58 PM  Result Value Ref Range Status   Specimen Description BLOOD LEFT ASSIST CONTROL  Final   Special Requests   Final    BOTTLES DRAWN AEROBIC AND ANAEROBIC  AERO 7CC ANA 2CC   Culture NO GROWTH 3 DAYS  Final   Report Status PENDING  Incomplete  Blood culture (routine x 2)     Status: None (Preliminary result)   Collection Time: 02/09/16  5:58 PM  Result Value Ref Range Status   Specimen Description BLOOD LEFT ARM  Final   Special Requests   Final    BOTTLES DRAWN AEROBIC AND ANAEROBIC  AERO 10CC ANA 1CC   Culture NO GROWTH 3 DAYS  Final   Report Status PENDING  Incomplete    RADIOLOGY:  Dg Chest Port 1 View  02/11/2016  CLINICAL DATA:  Shortness of breath onset tonight EXAM: PORTABLE CHEST 1 VIEW COMPARISON:  CT of the chest from 2 days ago FINDINGS: Now bilateral and increased perihilar interstitial and airspace opacity. No convincing or significant pleural effusion. No pneumothorax. No cardiomegaly when accounting for large hiatal hernia. IMPRESSION: Increased perihilar edema or  pneumonia. Electronically Signed   By: Marnee Spring M.D.   On: 02/11/2016 04:57    Follow up with PCP in 1 week.  Management plans discussed with the patient, family and they are in agreement.  CODE STATUS:     Code Status Orders        Start     Ordered   02/09/16 2144  Do not attempt resuscitation (DNR)   Continuous    Question Answer Comment  In the event of cardiac or respiratory ARREST Do not call a "code blue"   In the event of cardiac or respiratory ARREST Do not perform Intubation, CPR, defibrillation or ACLS   In the event of cardiac or respiratory ARREST Use medication by any route, position, wound care, and other measures to relive pain and suffering. May use oxygen, suction and manual treatment of airway obstruction as needed for comfort.      02/09/16 2143    Code Status History    Date Active Date Inactive Code Status Order ID Comments User Context   06/29/2015  9:33 PM 07/02/2015  4:25 PM DNR [id number]  Wyatt Haste, MD ED   06/29/2015  8:40 PM 06/29/2015  9:33 PM Full Code [id number]  Wyatt Haste, MD ED    Advance Directive Documentation        Most Recent Value   Type of Advance Directive  Healthcare Power of Attorney, Living will   Pre-existing out of facility DNR order (yellow form or pink MOST form)     "MOST" Form in Place?        TOTAL TIME TAKING CARE OF THIS PATIENT ON DAY OF DISCHARGE: more than 30 minutes.   Milagros Loll R M.D on 02/12/2016 at 2:21 PM  Between 7am to 6pm - Pager - [phone]  After 6pm go to [email or web address] - password [email or web address]  [facility]  Office  [phone]  CC: Primary care physician; Wynona Dove, MD  Note: This dictation was prepared with Dragon dictation along with smaller phrase technology. Any transcriptional errors that result from this process are unintentional.

## 2016-02-12 NOTE — Progress Notes (Signed)
Dr. Elpidio [name] has spoken to family and they have decided to discharge today with hospice care at home. Cardiology consult is no longer needed.

## 2016-02-12 NOTE — Progress Notes (Signed)
AVS printed and given to patient's daughter. Staff is getting patient ready for EMS transport now. Care management has completed tasks and medical necessity form for transport is on chart. Portable DNR given to daughter. Prescriptions faxed to [facility]. Waiting on Dr. Elpidio AnisSudini to page back to get his signature for xanax prescription that was printed. IV and tele will be removed once EMS arrives.

## 2016-02-12 NOTE — Progress Notes (Signed)
Patient's heart rate is periodically dropping as low as 40. Does not sustain that low, but patient is dropping frequently. BP is currently 151/51. Patient is having some small periods of apnea while trying to sleep, but patient is waking himself back up and his heart rate rises after that. Dr. Elpidio AnisSudini paged and MD stated to put in cardiology consult.

## 2016-02-12 NOTE — Progress Notes (Signed)
EMS called. IV and tele removed. Patient will go home with hospice care. Dr. Elpidio AnisSudini signed xanax prescription, given to daughter.

## 2016-02-12 NOTE — Discharge Instructions (Signed)

## 2016-02-12 NOTE — Care Management Note (Addendum)
Case Management Note  Patient Details  Name: [name] MRN: [id number] Date of Birth: 02/08/1911  Subjective/Objective:    Discussed discharge planning with Dr Elpidio AnisSudini and with daughter [name]. [name] chose [facility] to be [name] in his home provider of Hospice services. This Clinical research associatewriter called [name] and [facility] all [name]. This Clinical research associatewriter advised [name] that Hospice may not be able to see [name] in his home until tomorrow.                Action/Plan:   Expected Discharge Date:  02/11/16               Expected Discharge Plan:     In-House Referral:     Discharge planning Services     Post Acute Care Choice:    Choice offered to:     DME Arranged:    DME Agency:     HH Arranged:    HH Agency:     Status of Service:     Medicare Important Message Given:    Date Medicare IM Given:    Medicare IM give by:    Date Additional Medicare IM Given:    Additional Medicare Important Message give by:     If discussed at Long Length of Stay Meetings, dates discussed:    Additional Comments:  [name], RN 02/12/2016, 11:03 AM

## 2016-02-13 ENCOUNTER — Other Ambulatory Visit: Payer: Self-pay | Admitting: Internal Medicine

## 2016-02-13 ENCOUNTER — Telehealth: Payer: Self-pay | Admitting: *Deleted

## 2016-02-13 MED ORDER — MORPHINE SULFATE 20 MG/5ML PO SOLN: 2.5000 mg | ORAL | Status: AC | PRN

## 2016-02-13 MED ORDER — ALPRAZOLAM 0.5 MG PO TABS: 0.5000 mg | ORAL_TABLET | Freq: Three times a day (TID) | ORAL | Status: AC | PRN

## 2016-02-13 NOTE — Telephone Encounter (Signed)
This is fine to call in. They have typical standing orders for morphine through hospice. I am happy to sign those.

## 2016-02-13 NOTE — Telephone Encounter (Signed)
Faxed

## 2016-02-13 NOTE — Telephone Encounter (Signed)
Daughter [name] was called and stated that he was not doing to well. She stated that ER doctor stated it was not much more they could do for him.

## 2016-02-13 NOTE — Telephone Encounter (Signed)
[facility] ,Patient has agitation and a cough with gray mucus. She has requested liquid morphine, and lasix along with something for his agitation.  Tinnie GensJennie [phone]

## 2016-02-13 NOTE — Telephone Encounter (Signed)
Hospice nurse stated that, 1. A New xanax prescription needs to be sent in pt takes it every 4-6 hrs to keep him comfortable. 2. A Rx for Morphine 20 mg/1 mL, pt take 0.25 every 1 hr as needed. Disp: 30CC 3.patient is coughing up grey and bloody mucous but that is only when he is able to cough it up. 4.she stated she wanted the patches that go behind the ear?

## 2016-02-13 NOTE — Telephone Encounter (Signed)
Would this be okay to call in?

## 2016-02-13 NOTE — Telephone Encounter (Signed)
Needed for pt at hospice.

## 2016-02-13 NOTE — Telephone Encounter (Signed)
See hard copy Rx. Please confirm with his daughters that they are in agreement with plan.

## 2016-02-14 ENCOUNTER — Telehealth: Payer: Self-pay | Admitting: Internal Medicine

## 2016-02-14 LAB — CULTURE, BLOOD (ROUTINE X 2)
Culture: NO GROWTH
Culture: NO GROWTH

## 2016-02-14 NOTE — Telephone Encounter (Signed)
Fine to give above verbal orders.

## 2016-02-14 NOTE — Telephone Encounter (Signed)
[name] [phone] calling from Hospice she has several orders for the doctor. Verbal order for medication, removal of stitches that was put in 10 days ago at the hospital. Pt was discharged on Sunday.   Thank you!

## 2016-02-14 NOTE — Telephone Encounter (Signed)
[facility] with [facility] at [facility].  Patient was initially going to be a inpatient hospice house patient, due family has decided to keep him at the home.  She needs new orders for the following: Nebulizer albuterol 0.083% solution to use every 4 hours as needed. Senna S 1-2 tablets twice a day for constipation Has orders for Scopolamine patches- which are back ordered at all the pharmacies would like to switch to Hyoscyamine 0.125 mg Sublingual tablets every 4 hours for secretions. Prednisone 40 mg was stopped due to extreme agitation by the family, she would like to restart at 10 mg if possible to see if it helps with his rattling in his chest.   Order to remove the stitches in right arm from a fall 10 days ago.    All of these can be verbally given if okay.  thanks

## 2016-02-14 NOTE — Telephone Encounter (Signed)
Spoke with Misty StanleyLisa and gave verbal orders, thanks

## 2016-02-16 ENCOUNTER — Telehealth: Payer: Self-pay | Admitting: Internal Medicine

## 2016-02-16 NOTE — Telephone Encounter (Signed)
[facility] - [place] TELEPHONE ADVICE RECORD [facility] Patient Name: [name] DOB: 09/09/1911 Initial Comment Caller states, father fell this morning, due to weakness in knees. He has a slight fever and some productive cough. Nurse Assessment Nurse: Debera Latalston, RN, Tinnie GensJeffrey Date/Time Lamount Cohen(Eastern Time): 02/09/2016 11:32:46 AM Confirm and document reason for call. If symptomatic, describe symptoms. You must click the next button to save text entered. ---Caller states her father fell this morning, due to weakness in knees. He has a slight fever and some productive cough. Had previously fallen on Saturday and required trip to ED for stitches in arm and fell again today. Has the patient traveled out of the country within the last 30 days? ---No Does the patient have any new or worsening symptoms? ---Yes Will a triage be completed? ---Yes Related visit to physician within the last 2 weeks? ---Yes Does the PT have any chronic conditions? (i.e. diabetes, asthma, etc.) ---Yes List chronic conditions. ---HTN Is this a behavioral health or substance abuse call? ---No Guidelines Guideline Title Affirmed Question Affirmed Notes Weakness (Generalized) and Fatigue Patient sounds very sick or weak to the triager Cough - Acute Productive Coughing up rusty-colored (reddish-brown) sputum PLEASE NOTE: All timestamps contained within this report are represented as [place] Standard Time. CONFIDENTIALTY NOTICE: This fax transmission is intended only for the addressee. It contains information that is legally privileged, confidential or otherwise protected from use or disclosure. If you are not the intended recipient, you are strictly prohibited from reviewing, disclosing, copying using or disseminating any of this information or taking any action in reliance on or regarding this information. If you have received this fax in error, please notify us immediately  by telephone so that we can arrange for its return to us. Phone: [phone], Toll-Free: [phone], Fax: [phone] Page: 2 of 2 Call Id: [id number] Final Disposition User Go to ED Now (or PCP triage) Debera Latalston, RN, Fonnie JarvisJeffrey Comments Caller was taking patient [facility]. Referrals GO TO FACILITY OTHER - SPECIFY Disagree/Comply: Comply

## 2016-02-16 NOTE — Telephone Encounter (Signed)
[place]. Spoke with son and he states that the patient "had a better day yesterday. He is breathing better. The nurse came by and didn't hear anything".  Son will call back if needed.

## 2016-02-16 NOTE — Telephone Encounter (Signed)
[name] from [facility] sent you a email regarding pt it's in your mailbox. Thank you!

## 2016-02-20 ENCOUNTER — Telehealth: Payer: Self-pay | Admitting: Internal Medicine

## 2016-02-21 ENCOUNTER — Telehealth: Payer: Self-pay | Admitting: Internal Medicine

## 2016-02-21 NOTE — Telephone Encounter (Signed)
Fine to send in this refill, however he passed away last night.

## 2016-02-21 NOTE — Telephone Encounter (Signed)
[facility] dropped off pt's death certificate. Certificate will be placed in Dr. Tilman NeatWalker's box.

## 2016-02-28 ENCOUNTER — Ambulatory Visit: Payer: Medicare Other

## 2016-03-10 NOTE — Telephone Encounter (Signed)
Lisa from Hospice called. She needs another refill of morphine 20 MG per 1 ml. Pt is getting 0.6 mls every two hours. [facility]. Lisa's number [phone].

## 2016-03-10 DEATH — deceased

## 2016-04-12 ENCOUNTER — Ambulatory Visit: Payer: Medicare Other | Admitting: Podiatry

## 2016-04-25 ENCOUNTER — Ambulatory Visit: Payer: Medicare Other | Admitting: Internal Medicine

## 2016-11-22 IMAGING — CR DG CHEST 1V PORT
1 series · 1 of 1 positions shown · non-contrast
Comparison: 03/23/2015.

CLINICAL DATA: Generalized weakness and fever since this morning.
Hemoptysis.

EXAM:
PORTABLE CHEST 1 VIEW

[portable]
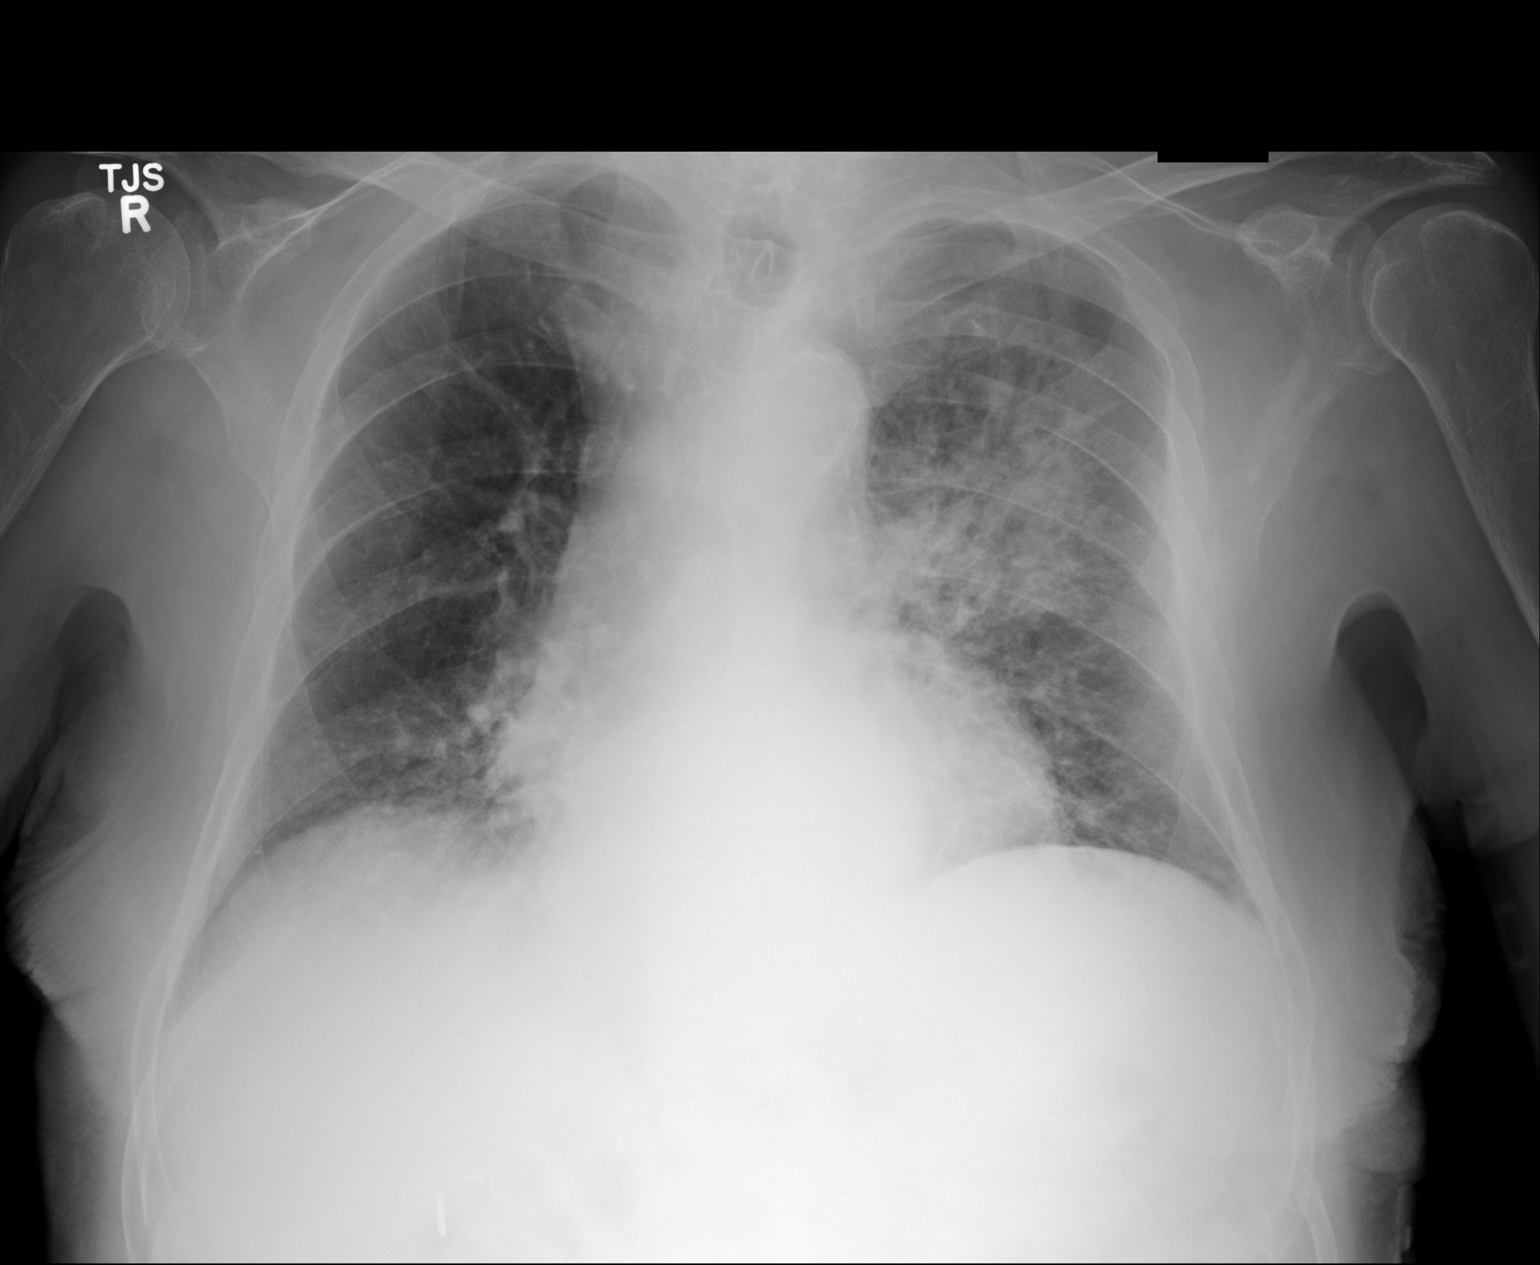

[1 of 1 positions shown; findings below may reference images not displayed]

FINDINGS: Trachea is midline. Heart size stable. Moderate hiatal hernia.
Patchy airspace consolidation in the left perihilar region. Lungs
are low in volume.
IMPRESSION: Patchy consolidation in the left perihilar region may be due to
pneumonia or pulmonary hemorrhage. Followup PA and lateral chest
X-ray is recommended in 3-4 weeks following trial of antibiotic
therapy to ensure resolution and exclude underlying malignancy.

## 2017-07-05 IMAGING — DX DG CHEST 1V PORT
1 series · 1 of 1 positions shown · non-contrast
Comparison: 10/19/2015

CLINICAL DATA: Weakness.  Febrile.

EXAM:
PORTABLE CHEST 1 VIEW

[chest ap]
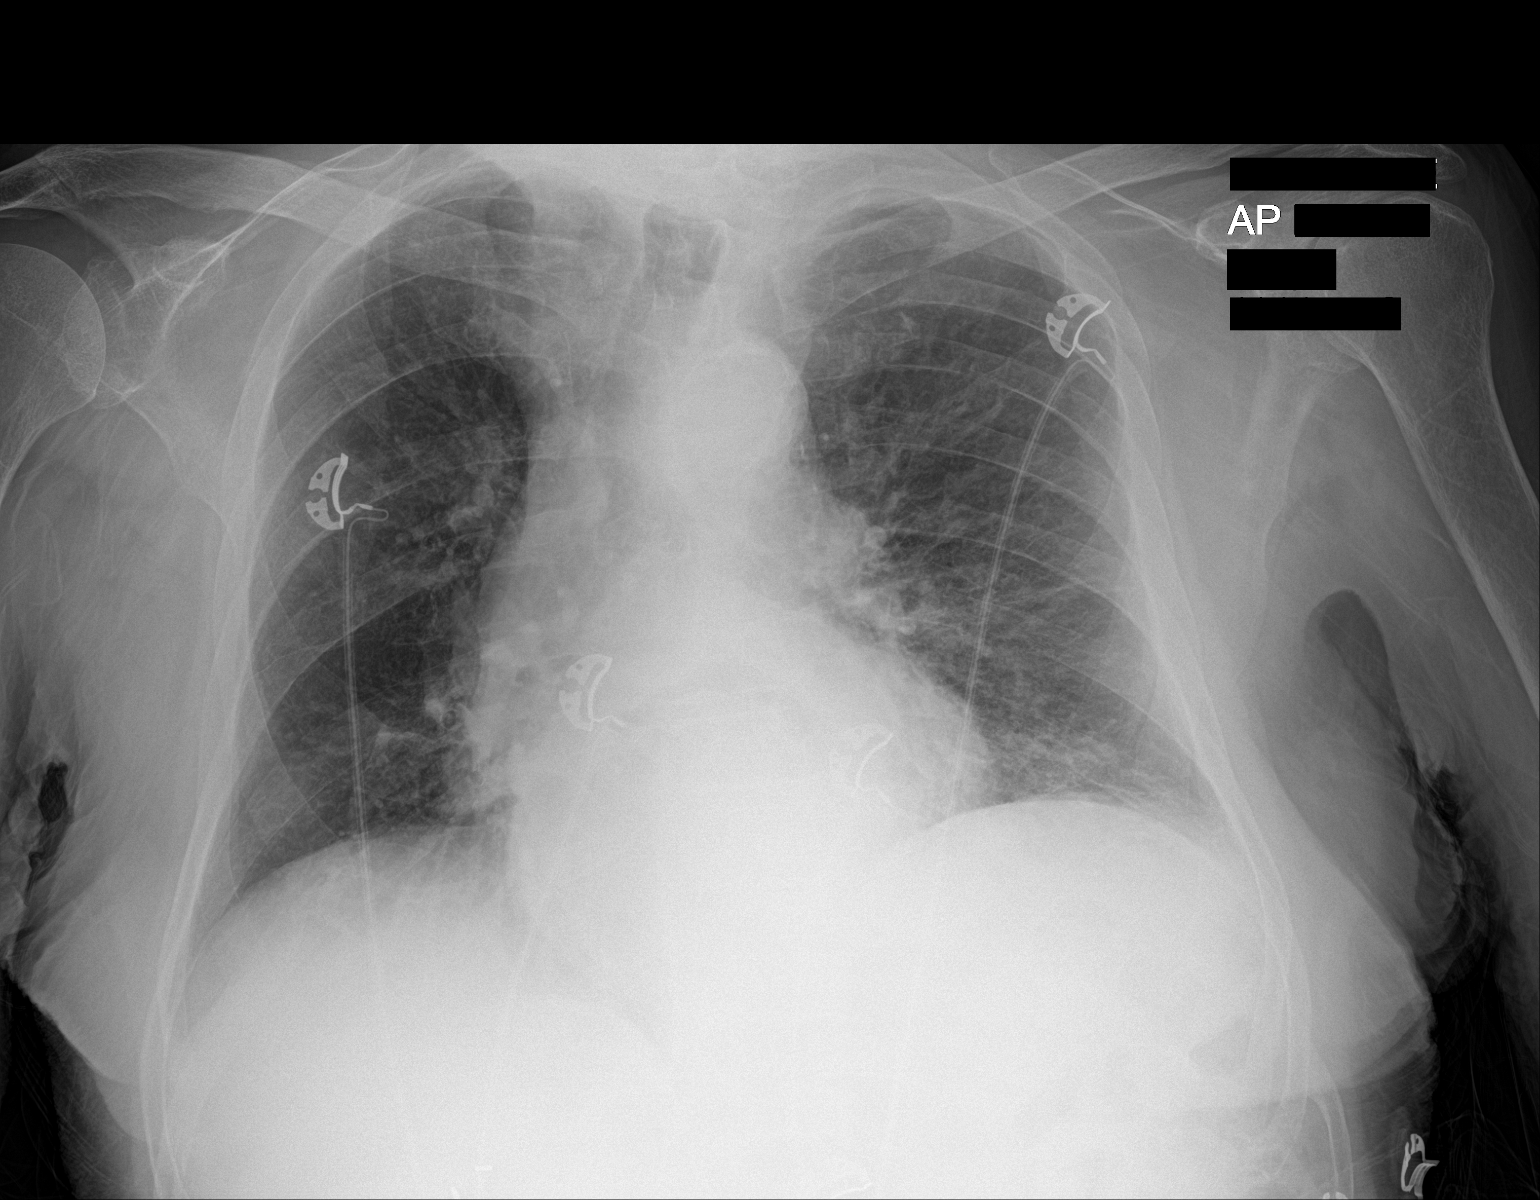

[1 of 1 positions shown; findings below may reference images not displayed]

FINDINGS: Midline trachea. Mild cardiomegaly with a tortuous thoracic aorta. A
moderate hiatal hernia. No pleural effusion or pneumothorax. Given
differences in technique, left base opacity is similar and favored
to represent scarring. No consolidation identified.
IMPRESSION: No acute cardiopulmonary disease.

Cardiomegaly without congestive failure.

Hiatal hernia.

## 2017-07-07 IMAGING — DX DG CHEST 1V PORT
1 series · 1 of 1 positions shown · non-contrast
Comparison: CT of the chest from 2 days ago

CLINICAL DATA: Shortness of breath onset tonight

EXAM:
PORTABLE CHEST 1 VIEW

[chest ap]
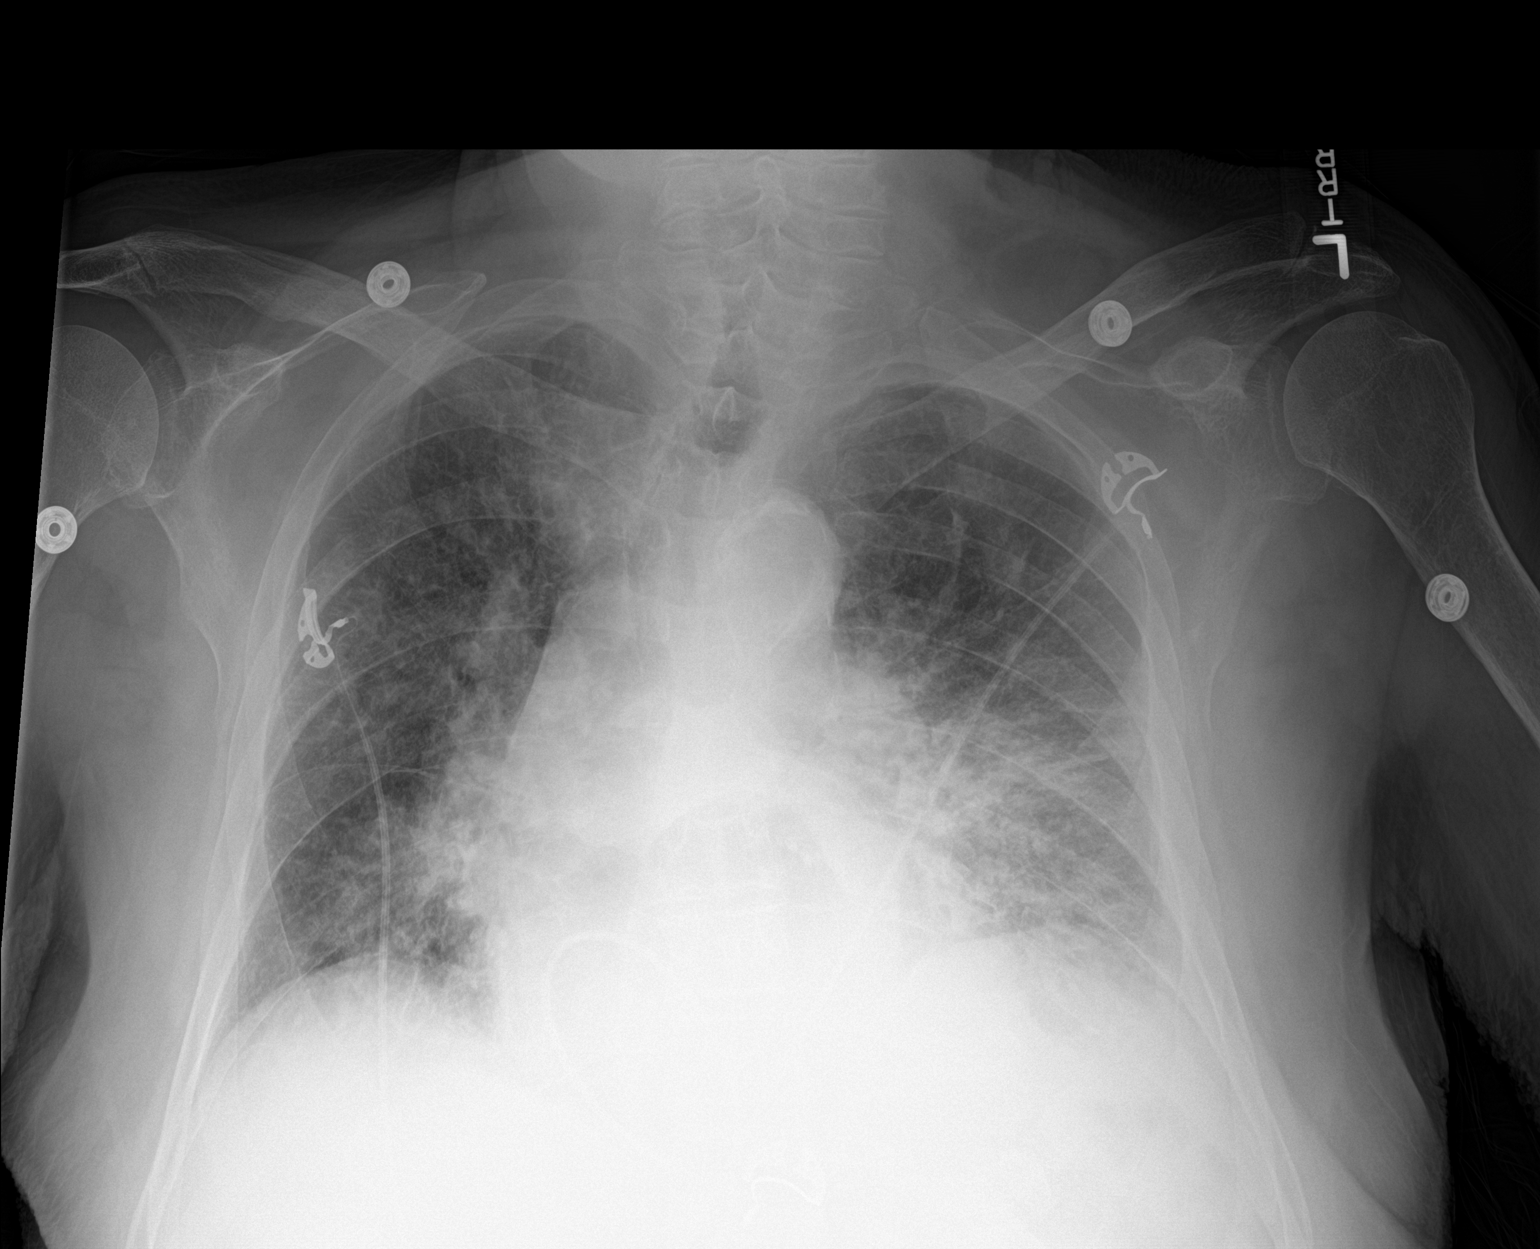

[1 of 1 positions shown; findings below may reference images not displayed]

FINDINGS: Now bilateral and increased perihilar interstitial and airspace
opacity. No convincing or significant pleural effusion. No
pneumothorax. No cardiomegaly when accounting for large hiatal
hernia.
IMPRESSION: Increased perihilar edema or pneumonia.
